# Patient Record
Sex: Female | Born: 1950 | Race: Black or African American | Hispanic: No | Smoking: Never smoker
Health system: Southern US, Community
[De-identification: ages and names within clinical notes are randomized; demographics above are authoritative.]

## PROBLEM LIST (undated history)

## (undated) DIAGNOSIS — N189 Chronic kidney disease, unspecified: Secondary | ICD-10-CM

## (undated) DIAGNOSIS — F32A Depression, unspecified: Secondary | ICD-10-CM

## (undated) DIAGNOSIS — M51369 Other intervertebral disc degeneration, lumbar region without mention of lumbar back pain or lower extremity pain: Secondary | ICD-10-CM

## (undated) DIAGNOSIS — M5136 Other intervertebral disc degeneration, lumbar region: Secondary | ICD-10-CM

## (undated) DIAGNOSIS — R7302 Impaired glucose tolerance (oral): Secondary | ICD-10-CM

## (undated) DIAGNOSIS — I10 Essential (primary) hypertension: Secondary | ICD-10-CM

## (undated) DIAGNOSIS — E785 Hyperlipidemia, unspecified: Secondary | ICD-10-CM

## (undated) DIAGNOSIS — K5790 Diverticulosis of intestine, part unspecified, without perforation or abscess without bleeding: Secondary | ICD-10-CM

## (undated) DIAGNOSIS — D126 Benign neoplasm of colon, unspecified: Secondary | ICD-10-CM

## (undated) DIAGNOSIS — F329 Major depressive disorder, single episode, unspecified: Secondary | ICD-10-CM

## (undated) HISTORY — DX: Chronic kidney disease, unspecified: N18.9

## (undated) HISTORY — DX: Impaired glucose tolerance (oral): R73.02

## (undated) HISTORY — DX: Benign neoplasm of colon, unspecified: D12.6

## (undated) HISTORY — DX: Diverticulosis of intestine, part unspecified, without perforation or abscess without bleeding: K57.90

## (undated) HISTORY — DX: Depression, unspecified: F32.A

## (undated) HISTORY — PX: APPENDECTOMY: SHX54

## (undated) HISTORY — DX: Major depressive disorder, single episode, unspecified: F32.9

## (undated) HISTORY — DX: Hyperlipidemia, unspecified: E78.5

## (undated) HISTORY — DX: Essential (primary) hypertension: I10

---

## 2000-07-08 HISTORY — PX: REPLACEMENT TOTAL KNEE: SUR1224

## 2001-09-22 ENCOUNTER — Other Ambulatory Visit: Admission: RE | Admit: 2001-09-22 | Discharge: 2001-09-22 | Payer: Self-pay | Admitting: Obstetrics and Gynecology

## 2003-03-14 ENCOUNTER — Emergency Department (HOSPITAL_COMMUNITY): Admission: EM | Admit: 2003-03-14 | Discharge: 2003-03-14 | Payer: Self-pay | Admitting: Emergency Medicine

## 2004-04-07 ENCOUNTER — Encounter: Payer: Self-pay | Admitting: Orthopedic Surgery

## 2004-05-08 ENCOUNTER — Encounter: Payer: Self-pay | Admitting: Orthopedic Surgery

## 2004-06-07 ENCOUNTER — Encounter: Payer: Self-pay | Admitting: Orthopedic Surgery

## 2005-03-03 ENCOUNTER — Emergency Department (HOSPITAL_COMMUNITY): Admission: EM | Admit: 2005-03-03 | Discharge: 2005-03-03 | Payer: Self-pay | Admitting: Emergency Medicine

## 2005-06-24 ENCOUNTER — Emergency Department (HOSPITAL_COMMUNITY): Admission: EM | Admit: 2005-06-24 | Discharge: 2005-06-25 | Payer: Self-pay | Admitting: Emergency Medicine

## 2005-07-08 DIAGNOSIS — D126 Benign neoplasm of colon, unspecified: Secondary | ICD-10-CM

## 2005-07-08 HISTORY — DX: Benign neoplasm of colon, unspecified: D12.6

## 2005-07-30 ENCOUNTER — Ambulatory Visit: Payer: Self-pay | Admitting: Internal Medicine

## 2005-08-02 ENCOUNTER — Ambulatory Visit (HOSPITAL_COMMUNITY): Admission: RE | Admit: 2005-08-02 | Discharge: 2005-08-02 | Payer: Self-pay | Admitting: Internal Medicine

## 2005-08-02 ENCOUNTER — Ambulatory Visit: Payer: Self-pay | Admitting: Internal Medicine

## 2005-08-02 ENCOUNTER — Encounter: Payer: Self-pay | Admitting: Internal Medicine

## 2005-08-02 HISTORY — PX: COLONOSCOPY: SHX174

## 2005-09-25 ENCOUNTER — Emergency Department (HOSPITAL_COMMUNITY): Admission: EM | Admit: 2005-09-25 | Discharge: 2005-09-25 | Payer: Self-pay | Admitting: Emergency Medicine

## 2005-11-16 ENCOUNTER — Emergency Department (HOSPITAL_COMMUNITY): Admission: EM | Admit: 2005-11-16 | Discharge: 2005-11-16 | Payer: Self-pay | Admitting: Emergency Medicine

## 2006-06-23 ENCOUNTER — Ambulatory Visit: Payer: Self-pay | Admitting: Internal Medicine

## 2006-07-14 ENCOUNTER — Ambulatory Visit: Payer: Self-pay | Admitting: Internal Medicine

## 2007-07-10 ENCOUNTER — Ambulatory Visit: Payer: Self-pay | Admitting: Internal Medicine

## 2007-11-13 ENCOUNTER — Emergency Department (HOSPITAL_COMMUNITY): Admission: EM | Admit: 2007-11-13 | Discharge: 2007-11-13 | Payer: Self-pay | Admitting: Emergency Medicine

## 2007-11-16 ENCOUNTER — Emergency Department (HOSPITAL_COMMUNITY): Admission: EM | Admit: 2007-11-16 | Discharge: 2007-11-16 | Payer: Self-pay | Admitting: Emergency Medicine

## 2008-02-13 ENCOUNTER — Emergency Department (HOSPITAL_COMMUNITY): Admission: EM | Admit: 2008-02-13 | Discharge: 2008-02-13 | Payer: Self-pay | Admitting: Emergency Medicine

## 2008-06-03 ENCOUNTER — Observation Stay (HOSPITAL_COMMUNITY): Admission: EM | Admit: 2008-06-03 | Discharge: 2008-06-05 | Payer: Self-pay | Admitting: Emergency Medicine

## 2008-06-04 ENCOUNTER — Encounter (INDEPENDENT_AMBULATORY_CARE_PROVIDER_SITE_OTHER): Payer: Self-pay | Admitting: General Surgery

## 2008-07-08 DIAGNOSIS — K5790 Diverticulosis of intestine, part unspecified, without perforation or abscess without bleeding: Secondary | ICD-10-CM

## 2008-07-08 HISTORY — DX: Diverticulosis of intestine, part unspecified, without perforation or abscess without bleeding: K57.90

## 2008-07-26 ENCOUNTER — Ambulatory Visit: Payer: Self-pay | Admitting: Internal Medicine

## 2008-08-22 ENCOUNTER — Ambulatory Visit: Payer: Self-pay | Admitting: Internal Medicine

## 2008-08-22 ENCOUNTER — Ambulatory Visit (HOSPITAL_COMMUNITY): Admission: RE | Admit: 2008-08-22 | Discharge: 2008-08-22 | Payer: Self-pay | Admitting: Internal Medicine

## 2008-08-22 HISTORY — PX: OTHER SURGICAL HISTORY: SHX169

## 2009-04-08 ENCOUNTER — Emergency Department (HOSPITAL_COMMUNITY): Admission: EM | Admit: 2009-04-08 | Discharge: 2009-04-08 | Payer: Self-pay | Admitting: Emergency Medicine

## 2009-10-30 ENCOUNTER — Emergency Department (HOSPITAL_COMMUNITY): Admission: EM | Admit: 2009-10-30 | Discharge: 2009-10-30 | Payer: Self-pay | Admitting: Emergency Medicine

## 2009-11-07 ENCOUNTER — Ambulatory Visit: Payer: Self-pay | Admitting: Internal Medicine

## 2009-11-07 DIAGNOSIS — K649 Unspecified hemorrhoids: Secondary | ICD-10-CM | POA: Insufficient documentation

## 2009-11-07 DIAGNOSIS — K5909 Other constipation: Secondary | ICD-10-CM

## 2009-11-07 DIAGNOSIS — Z860100 Personal history of colon polyps, unspecified: Secondary | ICD-10-CM | POA: Insufficient documentation

## 2009-11-07 DIAGNOSIS — K921 Melena: Secondary | ICD-10-CM

## 2010-04-03 ENCOUNTER — Ambulatory Visit: Payer: Self-pay | Admitting: Cardiology

## 2010-04-03 DIAGNOSIS — R079 Chest pain, unspecified: Secondary | ICD-10-CM | POA: Insufficient documentation

## 2010-04-03 DIAGNOSIS — E782 Mixed hyperlipidemia: Secondary | ICD-10-CM | POA: Insufficient documentation

## 2010-04-03 DIAGNOSIS — I10 Essential (primary) hypertension: Secondary | ICD-10-CM | POA: Insufficient documentation

## 2010-04-12 ENCOUNTER — Ambulatory Visit: Payer: Self-pay | Admitting: Cardiology

## 2010-04-12 ENCOUNTER — Encounter (HOSPITAL_COMMUNITY): Admission: RE | Admit: 2010-04-12 | Discharge: 2010-04-12 | Payer: Self-pay | Admitting: Cardiology

## 2010-04-12 ENCOUNTER — Encounter: Payer: Self-pay | Admitting: Cardiology

## 2010-04-13 ENCOUNTER — Ambulatory Visit: Payer: Self-pay | Admitting: Internal Medicine

## 2010-04-17 ENCOUNTER — Encounter: Payer: Self-pay | Admitting: Cardiology

## 2010-04-19 DIAGNOSIS — Z8719 Personal history of other diseases of the digestive system: Secondary | ICD-10-CM

## 2010-07-24 ENCOUNTER — Encounter (INDEPENDENT_AMBULATORY_CARE_PROVIDER_SITE_OTHER): Payer: Self-pay | Admitting: *Deleted

## 2010-07-27 ENCOUNTER — Ambulatory Visit
Admission: RE | Admit: 2010-07-27 | Discharge: 2010-07-27 | Payer: Self-pay | Source: Home / Self Care | Attending: Internal Medicine | Admitting: Internal Medicine

## 2010-08-07 NOTE — Letter (Signed)
Summary: [facility]  [facility]   Imported By: Raechel Ache [facility] 04/03/2010 16:49:44  _____________________________________________________________________  External Attachment:    Type:   Image     Comment:   External Document

## 2010-08-07 NOTE — Assessment & Plan Note (Signed)
Summary: RECTAL BLEEDING/SS   Visit Type:  consult Referring Provider:  Valeria Batman, [facility] ED Primary Care Provider:  [facility]  Chief Complaint:  rectal bleeding.  History of Present Illness: Ms. [name] [name] is a pleasant 60 y/o AA female, who presents today at request of Dr. Estell Harpin at [facility] ED, for further evaluation of brbpr. She was recently seen in ED with four day h/o small volume brbpr. She noted blood on tissue and in water. Associated with constipation. She came off her fiber pills which had been controlling her constipation. She past several hard stools around time of bleeding. She felt something "pop out" and then started noticing bleeding every time she wiped. Denies abd pain, frequent heartburn, weight loss, dysphagia, n/v.  She took course of hydrocortisone suppositories and rectal bleeding has resolved.   Her brother died due to metastatic colon cancer (dx in late 47s), funeral tomorrow. She has had two brothers died with colon cancer (dx in 79s) and her father was treated successfully for colon cancer in his 18s.    Current Medications (verified): 1)  Lisinopril 5 Mg Tabs (Lisinopril) .... Once Daily  Allergies (verified): 1)  ! Toradol 2)  ! * Latex  Past History:  Past Medical History: Depression Glucose intolerance Hyperlipidemia Hypertension TCS, 1/07 --> internal hemorrhoids, couple adenomatous polyps TCS/TI, 2/10 --> friable anal canal, early shallow left-sided diverticula  Past Surgical History: Appendectomy Hysterectomy Knee Replacement, 2002  Family History: Father, age 72, treated for colon cancer after age 79 Brother, 48, deceased due to metastatic colon cancer, diagnosed around mid 77s. Brother, deceased colon cancer, mid-50s Brother, deceased due to alcohol cirrhosis Mother, deceased, cancer-unknown source  Social History: Never smoked. Two children. Laid off, [employer] in [place] No alcohol, heavy in past but stopped  1994.  Review of Systems General:  Denies fever, chills, sweats, anorexia, weakness, and weight loss. Eyes:  Denies vision loss. ENT:  Denies nasal congestion, sore throat, hoarseness, and difficulty swallowing. CV:  Denies chest pains, angina, dyspnea on exertion, and peripheral edema. Resp:  Denies dyspnea at rest, dyspnea with exercise, and cough. GI:  See HPI. GU:  Denies urinary burning and blood in urine. MS:  Complains of joint pain / LOM. Derm:  Denies rash and itching. Neuro:  Denies weakness, frequent headaches, memory loss, and confusion. Psych:  Denies depression and anxiety. Endo:  Denies unusual weight change. Heme:  Denies bruising and bleeding. Allergy:  Denies hives and rash.  Vital Signs:  Patient profile:   60 year old female Height:      67 inches Weight:      230 pounds BMI:     36.15 Temp:     98.0 degrees F oral Pulse rate:   60 / minute BP sitting:   130 / 92  (left arm) Cuff size:   regular  Vitals Entered By: Hendricks Limes LPN (Nov 08, 2438 9:10 AM)  Physical Exam  General:  Well developed, well nourished, no acute distress. Head:  Normocephalic and atraumatic. Eyes:  Conjunctivae pink, no scleral icterus.  Mouth:  Oropharyngeal mucosa moist, pink.  No lesions, erythema or exudate.    Neck:  Supple; no masses or thyromegaly. Lungs:  Clear throughout to auscultation. Heart:  Regular rate and rhythm; no murmurs, rubs,  or bruits. Abdomen:  Bowel sounds normal.  Abdomen is soft, nontender, nondistended.  No rebound or guarding.  No hepatosplenomegaly, masses or hernias.  No abdominal bruits.  Rectal:  hemocult negative.  Palpable anal canal hemorrhoid. No masses in rectum. No stool present, secretions tested. Extremities:  No clubbing, cyanosis, edema or deformities noted. Neurologic:  Alert and  oriented x4;  grossly normal neurologically. Skin:  Intact without significant lesions or rashes. Cervical Nodes:  No significant cervical  adenopathy. Psych:  Alert and cooperative. Normal mood and affect.  Impression & Recommendations:  Problem # 1:  HEMATOCHEZIA (ICD-578.1)  Recent hematochezia likely due to benign anorectal disease. Has stopped with course of hydrocortisone suppositories. She has h/o internal hemorrhoids. Recent TCS 2/10 is reassuring. Advised treatment of constipation, initially can resume fiber supplements, but if needed can also use miralax 17 grams daily as needed. If she has recurrent or persistent bleeding she will let me know, otherwise OV with Dr. Jena Gauss in 3 months. Patient in agreement with plan.  Orders: Consultation Level III (11914) Hemoccult Guaiac-1 spec.(in office) (82270)

## 2010-08-07 NOTE — Assessment & Plan Note (Signed)
Summary: **NP6 CHEST PAIN AND ARM DISCOMFORT   Visit Type:  Initial Consult Primary Alek Poncedeleon:  Dr. Reynolds Bowl   History of Present Illness: 60 year old woman referred for cardiology consultation. She presents with a history of atypical chest pain as well as occasional feeling of shortness of breath and cough. She reports no clearly exertional symptoms, in fact reporting feeling well when she was exercising on a daily basis, walking up to 3 miles at a time at a local gym. She has not been exercising over the last few months related to an argument with her sister. She describes "sharp," brief, sporadic episodes of chest pain, usually associated with emotional upset. Also feeling of shortness of breath and cough that last for 5 minutes, fairly sporadic. She states she was treated with a course of antibiotics and steroids for possible strep throat a few weeks ago. Otherwise no other major health changes.  EKG, faxed copy, from 15 September showed sinus bradycardia at 55 beats per minute. She has not undergone any prior ischemic evaluation. Does report the death of a brother recently in his early 36s due to early onset cardiovascular disease. She has not been on regular therapy for hyperlipidemia and has a reported history of glucose intolerance, presently on no medications.  Current Medications (verified): 1)  Lisinopril 5 Mg Tabs (Lisinopril) .... Once Daily  Allergies (verified): 1)  ! Toradol 2)  ! * Latex  Past History:  Past Surgical History: Last updated: 11/07/2009 Appendectomy Hysterectomy Knee Replacement, 2002  Family History: Last updated: 04/03/2010 Father, age 58, treated for colon cancer after age 69 Brother, 57, deceased due to metastatic colon cancer, diagnosed around mid 60s. Brother, deceased colon cancer, mid-50s Brother, deceased due to alcohol cirrhosis Mother, deceased, cancer-unknown source Family History of Coronary Artery Disease: brother died in his 19s  after developing early onset cardiovascular disease  Social History: Last updated: 04/03/2010 Never smoked. Two children Laid off, [employer] in [place] No alcohol, heavy in past but stopped 1994  Past Medical History: Depression Glucose intolerance Hyperlipidemia Hypertension Chronic renal insufficiency TCS, 1/07 --> internal hemorrhoids, couple adenomatous polyps TCS/TI, 2/10 --> friable anal canal, early shallow left-sided diverticula  Family History: Father, age 30, treated for colon cancer after age 43 Brother, 47, deceased due to metastatic colon cancer, diagnosed around mid 40s. Brother, deceased colon cancer, mid-50s Brother, deceased due to alcohol cirrhosis Mother, deceased, cancer-unknown source Family History of Coronary Artery Disease: brother died in his 10s after developing early onset cardiovascular disease  Social History: Never smoked. Two children Laid off, [employer] in [place] No alcohol, heavy in past but stopped 1994  Review of Systems       The patient complains of chest pain.  The patient denies anorexia, fever, weight loss, syncope, dyspnea on exertion, peripheral edema, prolonged cough, hemoptysis, melena, and hematochezia.         No major sense of palpitations or dizziness. No dysphagia. No orthopnea or PND. No wheezing. Otherwise reviewed and negative.  Vital Signs:  Patient profile:   60 year old female Weight:      220 pounds BMI:     34.58 Pulse rate:   80 / minute BP sitting:   115 / 78  (right arm)  Vitals Entered By: Dreama Saa, CNA (April 03, 2010 1:06 PM)  Physical Exam  Additional Exam:  Overweight woman in no acute distress. HEENT: Conjunctiva and lids normal, oropharynx with moist mucosa. Neck: Supple, no elevated JVP or carotid bruits, no  thyromegaly. Lungs: Clear to auscultation, nonlabored. Cardiac: Regular rate and rhythm, no S3 or loud systolic murmur, no pericardial rub. Abdomen: Obese, nontender, bowel  sounds present. Skin: Warm and dry. Extremities: No pitting edema, distal pulses 2+. Musculoskeletal: No kyphosis. Neuropsychiatric: Alert and oriented x3, affect appropriate.   EKG  Procedure date:  04/03/2010  Findings:      Normal sinus rhythm at 73 beats per minute.  Impression & Recommendations:  Problem # 1:  CHEST PAIN (ICD-786.50)  Chest pain, fairly atypical in description, as well as occasional shortness of breath and cough. Not clearly exertional. Baseline electrocardiogram is normal. Cardiac risk factors include Brawn-standing hypertension, hyperlipidemia, glucose intolerance, and some family history of premature cardiovascular disease. She has not undergone any prior ischemic evaluation. Plan is to proceed with a 2-D echocardiogram as well as exercise Myoview. If low risk, would anticipate efforts at risk factor modification, specifically aggressive lipid and glucose control, and to return to regular exercise regimen. It may be that stress is contributing to her symptoms, possibly even reflux disease. If testing is significant abnormal, we can have her return to discuss the matter further.  Her updated medication list for this problem includes:    Lisinopril 5 Mg Tabs (Lisinopril) ..... Once daily  Orders: 2-D Echocardiogram (2D Echo) Nuclear Stress Test (Nuc Stress Test)  Problem # 2:  MIXED HYPERLIPIDEMIA (ICD-272.2)  Based on history. Patient reports that she was on a lipid-lowering medicine a few months ago. Details are not clear. Would suggest fairly aggressive lipid control, ideally with an LDL below 100.  Problem # 3:  ESSENTIAL HYPERTENSION, BENIGN (ICD-401.1)  Blood pressure well controlled today.  Her updated medication list for this problem includes:    Lisinopril 5 Mg Tabs (Lisinopril) ..... Once daily  Patient Instructions: 1)  Your physician recommends that you schedule a follow-up appointment in: post test 2)  Your physician recommends that you  continue on your current medications as directed. Please refer to the Current Medication list given to you today. 3)  Your physician has requested that you have an echocardiogram.  Echocardiography is a painless test that uses sound waves to create images of your heart. It provides your doctor with information about the size and shape of your heart and how well your heart's chambers and valves are working.  This procedure takes approximately one hour. There are no restrictions for this procedure. 4)  Your physician has requested that you have an exercise stress myoview.  For further information please visit [email or web address]  Please follow instruction sheet, as given.

## 2010-08-07 NOTE — Assessment & Plan Note (Signed)
Summary: FU IN 3 MONTHS WITH RMR PER LSL/SS   Visit Type:  Follow-up Visit Primary Care [name] [name]:  [name] [name]  Chief Complaint:  F/U rectal bleeding.  History of Present Illness: 60 year old lady with a history of intermittent painless hematochezia felt to be anorectal in origin. Colonoscopy 2010 demonstrated early left-sided diverticula and a friable anal canal. She has a history of hemorrhoids. She also has a very positive family history of colon cancer 3 for first degree relatives. She was here 3 mos ago; she's had 3 episodes of a small amount of rectal bleeding -  2 episode with straining one episode last week spontaneously. She is chronically constipated; does not take MiraLax regularly.  She does not take a fiber supplement. Hasn't had any associated abdominal pain.   Current Medications (verified): 1)  Lisinopril 5 Mg Tabs (Lisinopril) .... Once Daily 2)  Otc Fiberpill .... Three Daily  Allergies (verified): 1)  ! Toradol 2)  ! * Latex  Vital Signs:  Patient profile:   60 year old female Height:      67 inches Weight:      225 pounds BMI:     35.37 Temp:     97.8 degrees F oral Pulse rate:   68 / minute BP sitting:   122 / 82  (left arm) Cuff size:   large  Vitals Entered By: Cloria Spring LPN (April 13, 2010 2:05 PM)  Physical Exam  General:  pleasant 60 year old lady resting comfortably Abdomen:  nondistended positive bowel sounds soft nontender without mass or organomegaly  Impression & Recommendations: Impression: 60 year old lady with intermittent low-volume hematochezia in a setting of constipation. Impressive family  history for colon cancer and a personal hx of colonic adenoma; she has a  known hemorrhoids andl and left-sided diverticulosis.  Recommendations: MiraLax 17 g orally nightly if no bowel movement of any given day  10 day course of Anusol suppositories one per rectum at bedtime  Office visit in 3 months to assess her progress. Would have a low  threshold for moving up the timing of her next colonoscopy depending on her clinical response.  I do believe we are dealing with benign anorectal bleeding, however, we need to be vigilant in this setting.  Avoid constipation. Begin Benefiber 1 tablespoon daily samples from the office provided.  Appended Document: Orders Update    Clinical Lists Changes  Problems: Added new problem of DIVERTICULITIS, HX OF (ICD-V12.79) Orders: Added new Service order of Est. Patient Level IV ([address]) - Signed

## 2010-08-07 NOTE — Letter (Signed)
Summary: [place] Treadmill (Nuc Med Stress)  [facility]  [address], [place] [address]   Phone: [phone]  Fax: [phone]    Nuclear Medicine 1-Day Stress Test Information Sheet  Re:     Dakiya Gernert   DOB:     12-04-1950 MRN:     [id number] Weight:  Appointment Date: Register at: Appointment Time: Referring MD:  _X__Exercise Stress  __Adenosine   __Dobutamine  __Lexiscan  __Persantine   __Thallium  Urgency: ____1 (next day)   ____2 (one week)    ____3 (PRN)  Patient will receive Follow Up call with results: Patient needs follow-up appointment:  Instructions regarding medication:  How to prepare for your stress test: 1. DO NOT eat or drink after midnight the night prior to test. This includes no caffeine (coffee, tea, sodas, chocolate) if you were instructed to take your medications, drink water with it. 2. DO NOT use any tobacco products for at leaset 8 hours prior to arrival. 3. DO NOT wear dresses or any clothing that may have metal clasps or buttons. 4. Wear short sleeve shirts, loose clothing, and comfortalbe walking shoes. 5. DO NOT use lotions, oils or powder on your chest before the test. 6. The test will take approximately 3-4 hours from the time you arrive until completion. 7. To register the day of the test, go to the Short Stay entrance at [facility]. 8. If you must cancel your test, call [phone] as soon as you are aware.  After you arrive for test:   When you arrive at [facility], you will go to Short Stay to be registered. They will then send you to Radiology to check in. The Nuclear Medicine Tech will get you and start an IV in your arm or hand. A small amount of a radioactive tracer will then be injected into your IV. This tracer will then have to circulate for 30-45 minutes. During this time you will wait in the waiting room and you will be able to drink something without caffeine. A series of pictures will be taken  of your heart follwoing this waiting period. After the 1st set of pictures you will go to the stress lab to get ready for your stress test. During the stress test, another small amount of a radioactive tracer will be injected through your IV. When the stress test is complete, there is a short rest period while your heart rate and blood pressure will be monitored. When this monitoring period is complete you will have another set of pictrues taken. (The same as the 1st set of pictures). These pictures are taken between 15 minutes and 1 hour after the stress test. The time depends on the type of stress test you had. Your doctor will inform you of your test results within 7 days after test.    The possibilities of certain changes are possible during the test. They include abnormal blood pressure and disorders of the heart. Side effects of persantine or adenosine can include flushing, chest pain, shortness of breath, stomach tightness, headache and light-headedness. These side effects usually do not last Corl and are self-resolving. Every effort will be made to keep you comfortable and to minimize complications by obtaining a medical history and by close observation during the test. Emergency equipment, medications, and trained personnel are available to deal with any unusual situation which may arise.  Please notify office at least 48 hours in advance if you are unable to keep  this appt.

## 2010-08-07 NOTE — Letter (Signed)
Summary: [place] [facility], [facility] at [facility]  [address] [place] [address]   Phone: [phone]  Fax: [phone]      April 17, 2010 MRN: [id number]   [facility] [place] [address] [place], [place]  [address]   Dear Ms. Mccleary,  Your test ordered by Selena Batten has been reviewed by your physician (or physician assistant) and was found to be normal or stable. Your physician (or physician assistant) felt no changes were needed at this time.  ____ Echocardiogram  __X__ Cardiac Stress Test  ____ Lab Work  ____ Peripheral vascular study of arms, legs or neck  ____ CT scan or X-ray  ____ Lung or Breathing test  ____ Other:  Please continue on current medical treatment.  Thank you.   Nona Dell, MD, F.A.C.C

## 2010-08-09 NOTE — Letter (Signed)
Summary: Recall Office Visit  [facility]  [address]   [place], [place] [address]   Phone: [phone]  Fax: [phone]      July 24, 2010   [facility] [place] [address] [place], [place]  [address] 04-28-1951   Dear Ms. Haughton,   According to our records, it is time for you to schedule a follow-up office visit with [place].   At your convenience, please call [phone] to schedule an office visit. If you have any questions, concerns, or feel that this letter is in error, we would appreciate your call.   Sincerely,    Diana Eves  [facility] Ph: [phone]   Fax: [phone]

## 2010-08-09 NOTE — Assessment & Plan Note (Addendum)
Summary: dropped off stool/ss  pt returned ifobt and it was negative  Allergies: 1)  ! Toradol 2)  ! * Latex  Other Orders: Immuno-chemical Fecal Occult ([address])

## 2010-08-13 ENCOUNTER — Ambulatory Visit (INDEPENDENT_AMBULATORY_CARE_PROVIDER_SITE_OTHER): Payer: Medicaid Other | Admitting: Gastroenterology

## 2010-08-13 ENCOUNTER — Encounter: Payer: Self-pay | Admitting: Gastroenterology

## 2010-08-13 DIAGNOSIS — K5909 Other constipation: Secondary | ICD-10-CM

## 2010-08-23 NOTE — Assessment & Plan Note (Addendum)
Summary: FU OV IN 3 MONTHS,DIVERTICULITIS,CONSTIPATION/LAW   Vital Signs:  Patient profile:   60 year old female Height:      67 inches Weight:      228 pounds BMI:     35.84 Temp:     98.8 degrees F oral Pulse rate:   60 / minute BP sitting:   122 / 78  (left arm) Cuff size:   large  Vitals Entered By: Hendricks Limes LPN (August 13, 2010 10:55 AM)  Visit Type:  Follow-up Visit Primary Care Provider:  Casilda Carls   History of Present Illness: Pt presents today in f/u for hx of painless hematochezia and constipation. No reports of brbpr since last visit. Was treated with anusol suppositories and has done well. Reports BM 2X/week. Sometimes hard. Taking generic brand of fiber from [employer]; however, when was taking benefiber went once/day. Occasional bloating. Not taking miralax. Not on any stool softeners. Denies abdominal pain. ifobt Jan 2012 negative. Last TCS 2010 with left-sided diverticula and friable anal canal. Concerned because has several first-degree relatives with hx of colon ca. Brother just passed away a few months ago from this.   Current Medications (verified): 1)  Lisinopril 5 Mg Tabs (Lisinopril) .... Once Daily 2)  Otc Fiberpill .... Three Daily  Allergies (verified): 1)  ! Toradol 2)  ! * Latex  Past History:  Past Medical History: Reviewed history from 04/03/2010 and no changes required. Depression Glucose intolerance Hyperlipidemia Hypertension Chronic renal insufficiency TCS, 1/07 --> internal hemorrhoids, couple adenomatous polyps TCS/TI, 2/10 --> friable anal canal, early shallow left-sided diverticula  Review of Systems General:  Denies fever, chills, sweats, and anorexia. Eyes:  Denies blurring, irritation, and discharge. ENT:  Denies sore throat, hoarseness, and difficulty swallowing. CV:  Denies chest pains and syncope. Resp:  Denies dyspnea at rest and wheezing. GI:  Complains of constipation; denies difficulty swallowing, pain on  swallowing, nausea, indigestion/heartburn, abdominal pain, bloody BM's, and black BMs. GU:  Denies urinary burning and urinary frequency. MS:  Denies joint pain / LOM, joint swelling, and joint stiffness. Derm:  Denies rash, itching, and dry skin. Neuro:  Denies weakness and syncope. Psych:  Denies depression and anxiety. Endo:  Denies cold intolerance and heat intolerance.  Physical Exam  General:  Well developed, well nourished, no acute distress. Head:  Normocephalic and atraumatic. Lungs:  Clear throughout to auscultation. Heart:  Regular rate and rhythm; no murmurs, rubs,  or bruits. Abdomen:  +BS, soft, obese, non-tender, non-distended, no HSM, no rebound or guarding. no masses present Msk:  Symmetrical with no gross deformities. Normal posture. Neurologic:  Alert and  oriented x4;  grossly normal neurologically.   Impression & Recommendations:  Problem # 1:  CONSTIPATION, CHRONIC (ICD-3.35)  60 year old [place] American woman with hx of painless hematochezia thought to be secondary to benign anorectal source as well as significant chronic constipaiton. No brbpr since last visit; low threshold for repeat colonoscopy secondary to +FH of colon ca in several first-degree relatives. Not indicated at the moment, last done Jan 2012. Has done well with daily brand name fiber (benefiber). Only 2x/week if generic brand.  rx sent to pharmacy for benefiber daily Miralax each evening as needed for constipation Probiotic daily (samples given) F/U in 3 mos  Orders: Est. Patient Level II ([address])  Patient Instructions: 1)  Take supplemental fiber daily (prescription sent) 2)  Take Miralax each evening as needed for constipation 3)  Add a probiotic daily (samples given). This can be  purchased Advice worker.  4)  F/U in 3 mos.  5)  The medication list was reviewed and reconciled.  All changed / newly prescribed medications were explained.  A complete medication list was provided to the  patient / caregiver.  Prescriptions: BENEFIBER  POWD (WHEAT DEXTRIN) take 1 serving daily  #1 month x 3   Entered and Authorized by:   Gerrit Halls NP   Signed by:   Gerrit Halls NP on 08/13/2010   Method used:   Faxed to ...       [facility] (retail)       [address]       [place], [place]  [address]       Ph: [phone]       Fax: [phone]   RxID:   [id number] MIRALAX  POWD (POLYETHYLENE GLYCOL 3350) take 1 capful nightly as needed for constipation  #1 month x 3   Entered and Authorized by:   Gerrit Halls NP   Signed by:   Gerrit Halls NP on 08/13/2010   Method used:   Faxed to ...       [facility] (retail)       [address]       [place], [place]  [address]       Ph: [phone]       Fax: [phone]   RxID:   [id number]    Orders Added: 1)  Est. Patient Level II [[address]]  Appended Document: FU OV IN 3 MONTHS,DIVERTICULITIS,CONSTIPATION/LAW addendum: last colonoscopy 2010 not 2012.   Appended Document: FU OV IN 3 MONTHS,DIVERTICULITIS,CONSTIPATION/LAW 3 MONTH F/U OPV IS IN THE COMPUTER  Appended Document: FU OV IN 3 MONTHS,DIVERTICULITIS,CONSTIPATION/LAW let's get PR on pt. If constipation well-managed, we need to obtain an ifobt. If +, need to proceed with colonoscopy.

## 2010-09-25 LAB — COMPREHENSIVE METABOLIC PANEL
AST: 21 U/L (ref 0–37)
Albumin: 4.1 g/dL (ref 3.5–5.2)
Alkaline Phosphatase: 115 U/L (ref 39–117)
BUN: 14 mg/dL (ref 6–23)
Creatinine, Ser: 1.11 mg/dL (ref 0.4–1.2)
GFR calc Af Amer: >60 mL/min (ref >60)
Potassium: 3.8 mEq/L (ref 3.5–5.1)
Total Protein: 7.4 g/dL (ref 6.0–8.3)

## 2010-09-25 LAB — DIFFERENTIAL
Eosinophils Relative: 3 % (ref 0–5)
Lymphocytes Relative: 28 % (ref 12–46)
Monocytes Absolute: 0.3 10*3/uL (ref 0.1–1.0)
Monocytes Relative: 5 % (ref 3–12)
Neutro Abs: 3.2 10*3/uL (ref 1.7–7.7)

## 2010-09-25 LAB — CBC
HCT: 38.6 % (ref 36.0–46.0)
Platelets: 172 10*3/uL (ref 150–400)
RDW: 13.6 % (ref 11.5–15.5)

## 2010-09-28 ENCOUNTER — Encounter: Payer: Self-pay | Admitting: Urgent Care

## 2010-10-15 ENCOUNTER — Encounter: Payer: Self-pay | Admitting: Urgent Care

## 2010-10-15 ENCOUNTER — Ambulatory Visit (INDEPENDENT_AMBULATORY_CARE_PROVIDER_SITE_OTHER): Payer: Medicaid Other | Admitting: Urgent Care

## 2010-10-15 DIAGNOSIS — Z860100 Personal history of colon polyps, unspecified: Secondary | ICD-10-CM

## 2010-10-15 DIAGNOSIS — Z800 Family history of malignant neoplasm of digestive organs: Secondary | ICD-10-CM

## 2010-10-15 DIAGNOSIS — K5909 Other constipation: Secondary | ICD-10-CM

## 2010-10-15 DIAGNOSIS — K921 Melena: Secondary | ICD-10-CM

## 2010-10-15 NOTE — Assessment & Plan Note (Signed)
Next colonoscopy February 2015

## 2010-10-15 NOTE — Assessment & Plan Note (Signed)
Well controlled at this time with twice a day fiber supplement and when necessary MiraLax.

## 2010-10-15 NOTE — Progress Notes (Signed)
Primary Care Physician:  Forest Gleason, MD, MD Primary Gastroenterologist: Dr. Jena Gauss  Chief Complaint  Patient presents with  . Follow-up    chronic constipation, rectal bleeding, diverticulosis    HPI:  [name] [name] is a 60 y.o. female here for follow up for followup hematochezia.  Rectal bleeding resolved.  Bm twice per wk.  Denies straining or abd pain.  Wt stable.  Appetite ok.  Benefiber BID & Miralax once per week. Satisfied w/ BMs.  Is having some low back pain, strained her back yesterday while lifting a tire doing yard work.   Current Outpatient Prescriptions  Medication Sig Dispense Refill  . lisinopril (PRINIVIL,ZESTRIL) 5 MG tablet Take 5 mg by mouth daily.        . polyethylene glycol powder (MIRALAX) powder Take 17 g by mouth daily. Take 1 capful nightly as needed for constipation       . Wheat Dextrin (BENEFIBER) POWD Take by mouth 1 dose over 46 hours.          Allergies as of 10/15/2010 - Review Complete 10/15/2010  Allergen Reaction Noted  . Ketorolac tromethamine    . Latex      Review of Systems: Gen: Denies any fever, chills, sweats, anorexia, fatigue, weakness, malaise, weight loss, and sleep disorder CV: Denies chest pain, angina, palpitations, syncope, orthopnea, PND, peripheral edema, and claudication. Resp: Denies dyspnea at rest, dyspnea with exercise, cough, sputum, wheezing, coughing up blood, and pleurisy. GI: Denies vomiting blood, jaundice, and fecal incontinence.   Denies dysphagia or odynophagia. Derm: Denies rash, itching, dry skin, hives, moles, warts, or unhealing ulcers.  Psych: Denies depression, anxiety, memory loss, suicidal ideation, hallucinations, paranoia, and confusion. Heme: Denies bruising, bleeding, and enlarged lymph nodes.  Physical Exam: BP 133/87  Pulse 63  Temp 97.8 F (36.6 C)  Ht 5' 7.25" (1.708 m)  Wt 223 lb (101.152 kg)  BMI 34.67 kg/m2  SpO2 99% General:   Alert,  Well-developed, well-nourished, pleasant and cooperative  in NAD Head:  Normocephalic and atraumatic. Eyes:  Sclera clear, no icterus.   Conjunctiva pink. Mouth:  No deformity or lesions, dentition normal. Neck:  Supple; no masses or thyromegaly. Heart:  Regular rate and rhythm; no murmurs, clicks, rubs,  or gallops. Abdomen:  Soft, nontender and nondistended. No masses, hepatosplenomegaly or hernias noted. Normal bowel sounds, without guarding, and without rebound.   Msk:  Symmetrical without gross deformities. Normal posture. Pulses:  Normal pulses noted. Extremities:  Without clubbing or edema. Neurologic:  Alert and  oriented x4;  grossly normal neurologically. Skin:  Intact without significant lesions or rashes. Cervical Nodes:  No significant cervical adenopathy. Psych:  Alert and cooperative. Normal mood and affect.

## 2010-10-15 NOTE — Progress Notes (Signed)
RMR PT 

## 2010-10-15 NOTE — Assessment & Plan Note (Signed)
Next colonoscopy February 2015 

## 2010-10-15 NOTE — Patient Instructions (Addendum)
High fiber diet Continue Benefiber MiraLax 17 g daily as needed for constipation Colonoscopy 2015 Call if any further rectal bleeding  High-Fiber Diet A high-fiber diet changes your normal diet to include more whole grains, legumes, fruits, and vegetables. Changes in the diet involve replacing refined carbohydrates with unrefined foods. The calorie level of the diet is essentially unchanged. The Dietary Reference Intake (recommended amount) for adult males is 38 grams per day. For adult females, it is 25 grams per day. Pregnant and lactating women should consume 28 grams of fiber per day. Fiber is the intact part of a plant that is not broken down during digestion. Functional fiber is fiber that has been isolated from the plant to provide a beneficial effect in the body. PURPOSE  Increase stool bulk.   Ease and regulate bowel movements.   Lower cholesterol.  INDICATIONS THAT YOU NEED MORE FIBER  Constipation and hemorrhoids.   Uncomplicated diverticulosis (intestine condition) and irritable bowel syndrome.   Weight management.   As a protective measure against hardening of the arteries (atherosclerosis), diabetes, and cancer.  NOTE OF CAUTION If you have a digestive or bowel problem, ask your caregiver for advice before adding high-fiber foods to your diet. Some of the following medical problems are such that a high-fiber diet should not be used without consulting your caregiver. DO NOT USE WITH:  Acute diverticulitis (intestine infection).   Partial small bowel obstructions.   Complicated diverticular disease involving bleeding, rupture (perforation), or abscess (boil, furuncle).   Presence of autonomic neuropathy (nerve damage) or gastric paresis (stomach cannot empty itself).  GUIDELINES FOR INCREASING FIBER IN THE DIET  Start adding fiber to the diet slowly. A gradual increase of about 5 more grams (2 slices of whole-wheat bread, 2 servings of most fruits or vegetables, or 1  bowl of high-fiber cereal) per day is best. Too rapid an increase in fiber may result in constipation, flatulence, and bloating.   Drink enough water and fluids to keep your urine clear or pale yellow. Water, juice, or caffeine-free drinks are recommended. Not drinking enough fluid may cause constipation.   Eat a variety of high-fiber foods rather than one type of fiber.   Try to increase your intake of fiber through using high-fiber foods rather than fiber pills or supplements that contain small amounts of fiber.   The goal is to change the types of food eaten. Do not supplement your present diet with high-fiber foods, but replace foods in your present diet.  INCLUDE A VARIETY OF FIBER SOURCES  Replace refined and processed grains with whole grains, canned fruits with fresh fruits, and incorporate other fiber sources. White rice, white breads, and most bakery goods contain little or no fiber.   Brown whole-grain rice, buckwheat oats, and many fruits and vegetables are all good sources of fiber. These include: broccoli, Brussels sprouts, cabbage, cauliflower, beets, sweet potatoes, white potatoes (skin on), carrots, tomatoes, eggplant, squash, berries, fresh fruits, and dried fruits.   Cereals appear to be the richest source of fiber. Cereal fiber is found in whole grains and bran. Bran is the fiber-rich outer coat of cereal grain, which is largely removed in refining. In whole-grain cereals, the bran remains. In breakfast cereals, the largest amount of fiber is found in those with "bran" in their names. The fiber content is sometimes indicated on the label.   You may need to include additional fruits and vegetables each day.   In baking, for 1 cup white flour, you may  use the following substitutions:   1 cup whole-wheat flour minus 2 tablespoons.   1/2 cup white flour plus 1/2 cup whole-wheat flour.  References: Dietary Reference Intakes: Recommended Intakes for Individuals. [employer]. Institute of Medicine. Food and Nutrition Board. Document Released: 06/24/2005 Document Re-Released: 09/18/2009 [facility] Patient Information 2011 [place], [place].

## 2010-10-15 NOTE — Assessment & Plan Note (Addendum)
Resolved, suspect secondary to benign anorectal source. Patient is to call if hematochezia recurs.

## 2010-11-20 NOTE — Op Note (Signed)
NAME:  [name] [name], Mette                  ACCOUNT NO.:  [id number]   MEDICAL RECORD NO.:  [id number]          PATIENT TYPE:  AMB   LOCATION:  DAY                           FACILITY:  [facility]   PHYSICIAN:  R. Roetta Sessions, M.D. DATE OF BIRTH:  05/19/1951   DATE OF PROCEDURE:  08/22/2008  DATE OF DISCHARGE:                               OPERATIVE REPORT   PROCEDURE:  Ileocolonoscopy, diagnostic.   INDICATIONS FOR PROCEDURE:  A 60 year old lady with a personal history  of colonic adenomas, positive family history of colon cancer with 3  first-degree relatives, 2 with relatively young age.  She has had  intermittent low-volume hematochezia in the setting of known  hemorrhoids.  She is here for a surveillance/diagnostic purposes.  Risks, benefits, and limitations of the approach have been reviewed,  questions answered.  Please see the documentation in the medical record.   PROCEDURE NOTE:  O2 saturation, blood pressure, pulse, and respirations  were monitored throughout the entire procedure.   CONSCIOUS SEDATION:  Versed 5 mg IV and Demerol 100 mg IV in divided  doses.   INSTRUMENT:  Pentax video chip system.   FINDINGS:  Digital rectal exam revealed no abnormalities.  Endoscopic  findings:  Prep was adequate.  Colon:  Colonic mucosa was surveyed from  the rectosigmoid junction through the left transverse, right colon  appendiceal orifice, ileocecal valve, and cecum.  These structures well  seen and photographed for the record.  Terminal ileum was elevated to 10  cm.  From this level, scope was slowly withdrawn.  All previously  mentioned mucosal surfaces were again seen.  The patient was noted to  have early shallow scattered left-sided diverticulum.  Colonic mucosa  and terminal ileal mucosa appeared entirely normal.  The scope was  pulled down the rectum.  Thorough examination of the rectal mucosa  including en face view of the anal canal demonstrated a friable anal  canal tissues.  There  was no discrete hemorrhoid tag seen today.  The  rectal mucosa itself appeared entirely normal.  The patient tolerated  the procedure well and was reactive in Endoscopy.   IMPRESSION:  Friable anal canal, otherwise normal-appearing rectal  mucosa, early shallow left-sided diverticula, colonic mucosa, and  terminal ileal mucosa appeared normal.  I suspect the patient's bleeding  intermittently from anorectum.   RECOMMENDATIONS:  1. Diverticulosis literature provided to Ms. Dowling, Benefiber 1      tablespoon daily.  2. Anusol-HC Suppositories one per rectum at bedtime x10 days.  3. She should return for repeat colonoscopy in 5 years and I have also      recommended she and her family should have medical genetic testing      in one of the tertiary referral centers as family members may have      increased risk.      Jonathon Bellows, M.D.  Electronically Signed     RMR/MEDQ  D:  08/22/2008  T:  08/22/2008  Job:  [id number]   cc:   [facility]  [place], [place]

## 2010-11-20 NOTE — Op Note (Signed)
NAME:  [name] [name], [name] [name]                  ACCOUNT NO.:  [id number]   MEDICAL RECORD NO.:  [id number]          PATIENT TYPE:  OBV   LOCATION:  A337                          FACILITY:  [facility]   PHYSICIAN:  Dalia Heading, M.D.  DATE OF BIRTH:  [birth year]   DATE OF PROCEDURE:  06/04/2008  DATE OF DISCHARGE:                               OPERATIVE REPORT   PREOPERATIVE DIAGNOSIS:  Acute appendicitis.   POSTOPERATIVE DIAGNOSIS:  Acute appendicitis.   PROCEDURE:  Laparoscopic appendectomy.   SURGEON:  Dalia Heading, MD   ANESTHESIA:  General endotracheal.   INDICATIONS:  The patient is a 60 year old white female who presents  with a 24-hour history of worsening right lower quadrant abdominal pain.  The risks and benefits of the procedure including bleeding, infection,  and the possibly of an open procedure were fully explained to the  patient, gave informed consent.   PROCEDURE NOTE:  The patient was placed in the supine position.  After  induction of general endotracheal anesthesia, the abdomen was prepped  and draped in the usual sterile technique with Betadine.  Surgical site  confirmation was performed.   A supraumbilical incision was made down to the fascia.  A Veress needle  was introduced into the abdominal cavity and confirmation of placement  was done using the saline drop test.  The abdomen was then insufflated  to 16 mmHg pressure.  An 11-mm trocar was introduced into the abdominal  cavity under direct visualization without difficulty.  The patient was  placed in deeper Trendelenburg position.  Additional 12-mm trocar was  placed in suprapubic region.  A 5-mm trocar was placed in the left lower  quadrant region.  The appendix was visualized and noted be acutely  inflamed with surrounding edema of the mesentery.  The appendix was  divided using the Harmonic scalpel.  The base of the appendix was found.  A standard Endo-GIA was placed across the base of the appendix and  fired.   The appendix was removed using an Endocatch bag.  The right  lower quadrant and pelvis were copiously irrigated with normal saline.  No purulent fluid was found.  All fluid and air were evacuate from the  abdominal cavity prior to removal of the trocars.   All wounds were irrigated with normal saline.  All wounds were checked  with 0.5 % Sensorcaine.  The supraumbilical fascia was reapproximated  using an 0 Vicryl interrupted suture.  All skin incisions were closed  using staples.  Betadine ointment and dry sterile dressings were  applied.   All tape and needle counts correct at the end of the procedure.  The  patient was extubated in the operating room and went back to recovery  room awake and stable condition.   COMPLICATIONS:  None.   SPECIMEN:  Appendix.   BLOOD LOSS:  Minimal.      Dalia Heading, M.D.  Electronically Signed     MAJ/MEDQ  D:  06/04/2008  T:  06/04/2008  Job:  [id number]   cc:   Bethena Midget, MD

## 2010-11-20 NOTE — Assessment & Plan Note (Signed)
[name] [name], [name] [name]                   CHART#:  [id number]   DATE:  07/10/2007                       DOB:  03/16/1951   CHIEF COMPLAINT:  Followup chronic constipation and abdominal bloating.   The patient is a 60 year old female.  She has a history of chronic  constipation.  She has a history of intermittent hematochezia.  She had  a colonoscopy in January 2007 which revealed internal hemorrhoids  otherwise normal exam.  She did have some polyps ablated from the left  colon to the ascending colon which were adenomatous.  She is due for a  repeat colonoscopy in 2010.  She has been taking MiraLax intermittently.  It does seem to help with her constipation, however, more recently she  has ran out.  She has been taking over-the-counter laxative.  She rarely  sees intermittent small-volume hematochezia felt to be due to her  hemorrhoids.  She generally has a bowel movement every day or every  other day.  She has noticed recently that she has had some intermittent  nausea without vomiting.  This has been going on for 2 days.  She also  complains of water brash and heartburn and indigestion.  She has not  tried anything for this yet.  She does drink a significant amount of  coffee.  She denies any history of overeating.  She is overweight but  has lost 5 pounds in the past year.   </CURRENT MEDICATIOS  See updated list.   ALLERGIES:  LATEX AND TORADOL.   Objective weight 228 pounds, height 58 inches, temp 97.9, blood pressure  130/82, and pulse 76.  GENERAL:  The patient is a well-developed, obese female who is alert,  oriented, pleasant, cooperative in no acute distress.  HEENT: Sclerae clear.  Non-icteric.  Oropharynx pink and moist without  any lesions.  HEART:  Regular rate and rhythm.  Normal S1, S2 without murmurs, rubs or  gallops.  ABDOMEN:  Positive bowel sounds x4.  No bruits auscultated.  Nontender,  non-distended.  No rebound tenderness or guarding.  EXTREMITIES:  Without  clubbing or edema bilaterally.  SKIN:  Warm and dry without any rashes or jaundice.   ASSESSMENT:  Chronic constipation with intermittent hematochezia  secondary to hemorrhoids (none recently), has ran out of MiraLax and has  been using over-the-counter laxatives.  I discouraged their use.  She  should resume MiraLax.   New-onset dyspepsia and water brash.   Family history of colon cancer/personal history of adenomatous polyps.   PLAN:  1. She is to begin MiraLax 17 grams daily for constipation.  2. Constipation and GERD literature given for review.  3. Trial of Zantac, over-the-counter, or Pepcid AC for her new-onset      of dyspepsia.  If things are not resolved, she understands she is      to call me back and we will proceed with a trial of PPI.  4. Colonoscopy in February of 2010 given the history of adenomatous      polyps/family history of colon cancer.       [name] [name], N.P.  Electronically Signed     R. Roetta Sessions, M.D.  Electronically Signed    KJ/MEDQ  D:  07/11/2007  T:  07/11/2007  Job:  [id number]

## 2010-11-20 NOTE — H&P (Signed)
NAME:  [name], [name]                  ACCOUNT NO.:  [id number]   MEDICAL RECORD NO.:  [id number]          PATIENT TYPE:  AMB   LOCATION:  DAY                           FACILITY:  [facility]   PHYSICIAN:  R. Roetta Sessions, M.D. DATE OF BIRTH:  12/13/1950   DATE OF ADMISSION:  DATE OF DISCHARGE:  LH                              HISTORY & PHYSICAL   CHIEF COMPLAINT:  History of colonic adenomas, positive family history  of colon cancer, hematochezia.   HISTORY OF PRESENT ILLNESS:  Ms. [name] Vantuyl. [name] is a very pleasant 59-  year-old [place] American lady from [place], who I saw back in  2007 for colonoscopy.  She had adenomas in her left and right colon  which were removed.  She has a more positive family history of colon  cancer at relatively young ages in 2 brothers and her father.  In fact,  her baby brother just died of metastatic colorectal cancer a couple of  months ago.  Ms. [name] [name] has known internal hemorrhoids and is chronically  constipated and does have continued intermittent low-volume  hematochezia.  She had been on Amitiza, but this produced nausea and she  stopped taking it.  She takes over-the-counter agents, but is no longer  taking GlycoLax/MiraLax, although this seemed to work for her very  nicely previously.  She may go up to 2 weeks without a bowel movement.  She does not take an over-the-counter agent.   Since I last saw this nice lady (July 11, 2007), she developed right  lower quadrant abdominal pain, nausea, and vomiting just before  Christmas 2009 and was ultimately diagnosed with acute appendicitis and  underwent a laparoscopic cholecystectomy by Dr. Lovell [name].  She is doing  well from that episode, not really having any abdominal pain now.  No  nausea, vomiting, odynophagia, dysphagia, early satiety, reflux  symptoms.   PAST MEDICAL HISTORY:  Glucose intolerance, hypertension,  hypercholesterolemia, depression, right knee replacement, hysterectomy.   PAST  SURGERIES:  Right knee replacement, hysterectomy, appendectomy.   CURRENT MEDICATIONS:  Currently not taking any medications, but ran out  of the blood pressure medication recently, previously on Atacand.   ALLERGIES:  TORADOL and LATEX (contact dermatitis).   FAMILY HISTORY:  As outlined above.   SOCIAL HISTORY:  The patient does not use tobacco or alcohol or illicit  drugs.   REVIEW OF SYSTEMS:  No chest pain.  No dyspnea.  No fever or chills.  Weight is down 8 pounds from which she weighed on June 23, 2006,  stable from July 10, 2007.   PHYSICAL EXAMINATION:  GENERAL:  A pleasant 60 year old lady resting  comfortably.  VITAL SIGNS:  Weight 228, height 5 feet 4 inches, temperature 98.8, BP  142/90, pulse 80.  SKIN:  Warm and dry.  HEENT:  No scleral icterus.  Conjunctivae are pink.  CHEST/LUNGS:  Clear to auscultation.  CARDIAC:  Regular rate and rhythm without murmur, gallop, or rub.  BREAST:  Deferred.  ABDOMEN:  Nondistended.  Laparoscopic port sites well healed.  Positive  bowel sounds.  Soft, entirely  nontender without appreciable mass or  organomegaly.  EXTREMITIES:  No edema.  RECTAL:  Deferred at the time of colonoscopy.   IMPRESSIONS:  Ms. [name] Everly. [name] is a very pleasant 60 year old lady with  a personal history colonic adenomas and a marked positive family history  of colon cancer in 3 first-degree relatives with 2 at relatively young  ages (one sibling younger than [name] [name]).  She has intermittent low-  volume hematochezia in the setting of known hemorrhoids.  She was slated  to have a 3-year followup colonoscopy at that time, has arrived.  I told  [name] [name] that we have to go ahead and do a surveillance/diagnostic  colonoscopy in the near future at [facility].  We would like to  wait a little longer since she just had an appendectomy last month.  We  will slate her for colonoscopy in middle of February.  Risks, benefits,  alternatives, limitations  have been reviewed, questions answered.  She  is agreeable.  We will make further recommendations at that time.      Jonathon Bellows, M.D.  Electronically Signed     RMR/MEDQ  D:  07/26/2008  T:  07/26/2008  Job:  [id number]   cc:   [facility]

## 2010-11-20 NOTE — Discharge Summary (Signed)
NAME:  [name] [name], [name] [name]                  ACCOUNT NO.:  [id number]   MEDICAL RECORD NO.:  [id number]          PATIENT TYPE:  OBV   LOCATION:  A337                          FACILITY:  [facility]   PHYSICIAN:  Dalia Heading, M.D.  DATE OF BIRTH:  1951/02/11   DATE OF ADMISSION:  06/03/2008  DATE OF DISCHARGE:  11/29/2009LH                               DISCHARGE SUMMARY   HOSPITAL COURSE SUMMARY:  The patient is a 60 year old black female who  presented to the emergency room with worsening upper abdominal pain.  She was admitted by the hospitalist for further evaluation and  treatment.  Her pain subsequently radiated to the right lower quadrant.  A CT scan of the abdomen and pelvis was performed, which revealed acute  appendicitis.  A surgery consultation was obtained and the patient was  taken to the operating room on June 04, 2008, and underwent  laparoscopic appendectomy.  She tolerated the procedure well.  Postoperative course has been unremarkable.  Her diet was advanced  without difficulty.   The patient is being discharged home on postoperative day #1 in good  improving condition.   DISCHARGE INSTRUCTIONS:  The patient is to followup Dr. Franky Macho on  June 14, 2008.   DISCHARGE MEDICATIONS:  1. Vicodin 1-2 tablets p.o. q.4 h. p.r.n. pain.  2. Lisinopril/hydrochlorothiazide 1 tablet p.o. daily.   PRINCIPAL DIAGNOSES:  1. Acute appendicitis.  2. Hypertension.   PRINCIPAL PROCEDURE:  Laparoscopic appendectomy on June 04, 2008.      Dalia Heading, M.D.  Electronically Signed     MAJ/MEDQ  D:  06/05/2008  T:  06/05/2008  Job:  [id number]   cc:   Dr. Lacie Scotts

## 2010-11-20 NOTE — H&P (Signed)
NAME:  [name] [name], [name] [name]                  ACCOUNT NO.:  [id number]   MEDICAL RECORD NO.:  [id number]          PATIENT TYPE:  OBV   LOCATION:  A337                          FACILITY:  [facility]   PHYSICIAN:  Margaretmary Dys, M.D.DATE OF BIRTH:  1951-06-11   DATE OF ADMISSION:  06/03/2008  DATE OF DISCHARGE:  LH                              HISTORY & PHYSICAL   ADMISSION DIAGNOSES:  1. Acute abdominal/epigastric pain.  2. Probable peptic ulcer disease.  3. Dehydration.  4. Nausea with vomiting, possibly acute gastritis.  5. Hypotension.  6. Possibly hypovolemia from dehydration.   CHIEF COMPLAINT:  Nausea, vomiting and abdominal pain of 1 day duration.   HISTORY OF PRESENT ILLNESS:  Ms. Hatfield is a 60 year old female who  presents to the emergency room with mostly epigastric pain.  The patient  said she developed acute onset of epigastric pain at about 6 a.m. this  morning.  She subsequently had some nausea and vomiting.  She vomited  mostly greenish material.  She saw no blood.  The patient said the  abdominal pain improved slightly, but came back again later.  It  appeared to be worsened after she ate some [place] sandwich, although she  did not throw up the [place] sandwich.  She rates her pain right now as  10/10.  It is nonradiating, not radiating to the back or to other parts  of her abdomen.  It has mostly remained in the epigastrium.  She denies  any fevers.  She has had some nausea, but no more vomiting episodes.  She denies any diarrhea.  She had a bowel movement early this morning.  Denies any alcohol ingestion.  She denies any concerns for food  poisoning.  She lives with her daughter and she does not have any of  those symptoms.  Pain is mostly colicky in nature, waxing and waning.  There is also no history of cramping occasionally.   REVIEW OF SYSTEMS:  A 10-point review of systems otherwise negative  except as mentioned in history of present illness.   PAST MEDICAL HISTORY:  1. History of GI bleed likely related to polyp.  2. History of hypertension.  3. Dyslipidemia.  4. Depression.  5. History of gastroesophageal reflux disease.  6. History of asthma.   MEDICATIONS:  1. Atacand 80 1 tablet daily.  2. Ventolin inhaler 2 puffs as needed.  3. Avelox 400 mg p.o. once a day.   ALLERGIES:  LATEX.   SOCIAL HISTORY:  The patient is married, lives with her daughter.  No  history of alcohol, drug or tobacco use.   FAMILY HISTORY:  Positive for hypertension, diabetes, colon cancer in a  brother and the father.  Lung cancer in another brother.   PAST SURGICAL HISTORY:  Status post right knee replacement about a year  ago.   PHYSICAL EXAMINATION:  GENERAL:  The patient was conscious, alert,  comfortable in mild pain distress.  VITAL SIGNS:  Blood pressure was 108/73.  The patient actually was  orthostatic in the emergency room when blood pressure dropped to 95/53.  Her pulse  was 81, respirations 20, temperature 98.4 degrees Fahrenheit,  oxygen saturation is 98% on room air.  HEENT:  Normocephalic, atraumatic.  Oral mucosa was dry.  No exudates  were noted.  NECK:  Supple.  No JVD, lymphadenopathy.  LUNGS:  Clinically good air entry bilaterally.  HEART:  S1-S2, regular.  No S3, S4, gallops or rubs.  ABDOMEN:  Soft.  The patient had moderate-severe epigastric tenderness  with some rebound and guarding.  Rest of abdomen was fairly benign and  soft.  There were good bowel sounds.  EXTREMITIES:  No pitting or pedal edema.  No calf induration or  tenderness was noted.  CENTRAL NERVOUS SYSTEM:  Grossly intact other  than being distressed from pain.   LABORATORY DATA:  White blood cell count 11.3, hemoglobin of 13.4,  hematocrit 39.5, platelet count was 176 with 84% neutrophils, sodium is  137, potassium is 3.3, chloride of 102, CO2 is 29, glucose 126, BUN of  18, creatinine is 1.19, AST 24, ALT of 29, lipase is 22.  Urinalysis was  negative.   DIAGNOSTICS:   Abdominal x-ray shows no definite acute findings.   ASSESSMENT:  A 60 year old female presenting with acute onset of nausea,  vomiting and acute epigastric pain.  The patient could have an acute  gastritis or possibly peptic ulcer disease.   PLAN:  1. The patient will be admitted to the medical floor.  2. We will give IV fluid hydration.  3. We will place the patient on the Protonix 40 mg IV q.12 h.  4. DVT prophylaxis will be Lovenox.  5. I will put the patient on Phenergan and Zofran p.r.n. for nausea      and vomiting.  6. We will hold the patient's antihypertensive medications at home.  7. The patient will be placed on a clear liquid diet for now.   I have explained the above plan to the patient.  She verbalized full  understanding.      Margaretmary Dys, M.D.  Electronically Signed    AM/MEDQ  D:  06/03/2008  T:  06/04/2008  Job:  [id number]

## 2010-11-23 NOTE — Consult Note (Signed)
NAME:  [name] [name], [name] [name]                  ACCOUNT NO.:  [id number]   MEDICAL RECORD NO.:  [id number]          PATIENT TYPE:  AMB   LOCATION:                                FACILITY:  [facility]   PHYSICIAN:  R. Roetta Sessions, M.D. DATE OF BIRTH:  [birth year]   DATE OF CONSULTATION:  07/30/2005  DATE OF DISCHARGE:                                   CONSULTATION   PHYSICIAN REQUESTING CONSULTATION:  Vania Rea, M.D. hospitalist to  [facility]   REASON FOR CONSULTATION:  Rectal bleeding.   HISTORY OF PRESENT ILLNESS:  [name] [name] is a 60 year old African American female  who presents today at the request of Dr. Orvan Falconer for further evaluation of  rectal bleeding. She went to the emergency department in December because of  complaints of rectal bleeding and rectal pain. She says since she had her  right knee replacement in May 2006 she has had constipation. She takes one  stool softener daily. She often has rectal pain with bowel movements. She  has passed bright red blood per rectum as well as clots. She takes Anusol  suppositories or equivalent intermittently with some relief of the bleeding.  She generally has a bowel movement once a week. Denies any melena. She has  abdominal discomfort related to constipation. No nausea or vomiting, heart  burn. She reports having a colonoscopy in the remote past. She does not  recall many of the details, but according to Dr. Blair Dolphin note she had  rectal polyps removed seven years ago by Dr. Harold Hedge in [place].   FAMILY HISTORY:  Colon cancer. One brother had colon cancer with surgery at  age 71. He has a colostomy. She believes her father also had colon cancer,  but she is not sure if it was prostate cancer instead.   While in the emergency department she had a normal hemoglobin 12.7,  hematocrit 36.2, MCV 92.1. A CT of the abdomen and pelvis with contrast  revealed minimal pericardial effusion otherwise unremarkable.   CURRENT MEDICATIONS:  1.  Atacand 10 mg daily.  2.  Stool softener one daily.   ALLERGIES:  1.  LATEX.  2.  TORADOL causes difficulty with breathing.   PAST MEDICAL HISTORY:  1.  Borderline diabetes mellitus.  2.  Depression.  3.  Hypercholesterolemia.  4.  Hypertension.   PAST SURGICAL HISTORY:  1.  Right knee replacement in May 2006.  2.  She initially had a partial hysterectomy followed by a bilateral      oophorectomy.  3.  Colonoscopy in the remote past. Details unavailable.   FAMILY HISTORY:  Mother died at age 41 of unknown cancer. Father is 18 years  old, was treated for colon cancer or prostate cancer in August in 2005.  Details unavailable. She has a brother who was treated for colon cancer in  his 8s. Another brother treated for lung cancer in his 23s.   SOCIAL HISTORY:  She is separated. She has a daughter and a son. She works  for [employer] but states she is out on medical leave.  She has never been a  smoker. She denies any alcohol use.   REVIEW OF SYSTEMS:  Gastrointestinal:  See HPI. Constitutional:  Denies any  weight loss. Cardiopulmonary: No chest pain, palpitations or shortness of  breath.   PHYSICAL EXAMINATION:  VITAL SIGNS:  Weight 228-1/2, height 5 feet 6 inches,  temperature 98, blood pressure 142/82, pulse 82.  GENERAL:  A pleasant, well-nourished, well-developed black female in no  acute distress.  SKIN:  Warm and dry, no jaundice.  HEENT:  Conjunctivae are pink. Sclerae nonicteric. Oropharyngeal mucosa  moist and pink. No lesions, erythema or exudate. No lymphadenopathy,  thyromegaly.  CHEST:  Lungs are clear to auscultation.  CARDIAC:  Regular rate and rhythm. Normal S1, S2. No murmurs, rubs or  gallops.  ABDOMEN:  Positive bowel sounds, soft, nontender, nondistended, no  organomegaly or masses. No rebound, tenderness or guarding. No abdominal  bruits or hernias.  EXTREMITIES:  No edema.  RECTAL:  Deferred at time of colonoscopy.   IMPRESSION:  [name] [name] is a  60 year old lady with history of hematochezia  associated with rectal pain. She may have an anorectal fissure based on  clinical symptoms. Her family history is significant for colon cancer in a  brother at age 58. She reports remote colonoscopy. She needs to have a  colonoscopy for diagnostic as well as high risk surveillance purposes. She  also complains of constipation. I discussed risks, alternatives and benefits  of colonoscopy with patient. She is agreeable to proceed.   PLAN:  1.  Colonoscopy in near future.  2.  Given right knee replacement 8 months ago we will go ahead and cover her      with antibiotics in the way of gentamicin and ampicillin.  3.  Begin MiraLax 17 g nightly p.r.n. constipation #527 g with 5 refills.     Tana Coast, P.AJonathon Bellows, M.D.  Electronically Signed   LL/MEDQ  D:  07/30/2005  T:  07/30/2005  Job:  [id number]   cc:   Vania Rea, M.D.

## 2010-11-23 NOTE — Consult Note (Signed)
NAME:  [name] [name], [name] [name]                  ACCOUNT NO.:  [id number]   MEDICAL RECORD NO.:  [id number]          PATIENT TYPE:  EMS   LOCATION:  ED                            FACILITY:  [facility]   PHYSICIAN:  Vania Rea, M.D. DATE OF BIRTH:  02-10-1951   DATE OF CONSULTATION:  06/25/2005  DATE OF DISCHARGE:                                   CONSULTATION   PRIMARY CARE PHYSICIAN:  Dr. Letitia Libra from [facility].   PHYSICIAN REQUESTING CONSULTATION:  Chauncy Passy, M.D., emergency  room physician.   REASON FOR CONSULTATION:  Rectal pain and bleeding.   CONSULTING PHYSICIAN:  Vania Rea, M.D.   IMPRESSION:  1.  History of acute rectal bleeding.  2.  Tender rectal examination without any evidence of  acute bleeding.  3.  No evidence of  anemia.  4.  No evidence of  orthostasis, likely local rectal problem.  5.  Family history of colon cancers.  6.  Dermatitis anterior chest.   RECOMMENDATIONS:  1.  CT scan of pelvis now, with p.o. and IV contrast.  2.  Follow up with Dr. Karilyn Cota or Dr. Jena Gauss as an outpatient for colonoscopy.  3.  Return to the emergency room for heavy bleeding.  4.  Hydrocortisone cream for eczema of chest.   HISTORY OF PRESENT ILLNESS:  This is a 60 year old African-American lady  with a history of hypertension and strong family history of colon cancer who  had colonoscopy with removal of rectal polyps about 7 years ago by Dr. Harold [name] in [place], who suffers with chronic constipation for which she  takes stool softeners and whose last bowel movement was 2 days ago and who  says since yesterday she has been having stool urgency but passing only some  small amounts of bright red blood when she goes to the bathroom.  The  patient says earlier today she felt dizzy and decided to come to the  emergency room tonight.  She also says she has been having a slight amount  of lower abdominal pain but most of the pain appears to be in her  rectum.  She is having no fever, no chest pains, no shortness of breath, and no  dizziness.  She is having no diarrhea or vomiting.  She denies any black  stool or any hematochezia.   PAST MEDICAL HISTORY:  1.  Hypertension.  2.  Pain in the right knee, status post right knee replacement about a year      ago by Dr. Orvan Falconer in [place].   MEDICATIONS:  1.  Atacand  daily.  2.  Calcium gluconate twice daily.  3.  Stool softeners.  4.  Ended a course of Flexeril and steroids for joint pains about a week      ago.   ALLERGIES:  LATEX.   SOCIAL HISTORY:  Denies tobacco, alcohol, or illicit drug use.   FAMILY HISTORY:  Significant for diabetes and hypertension.  Also colon  cancer in a brother and the father and lung cancer in another brother.   REVIEW OF SYSTEMS:  A 10-point review of systems reveals only rash on the  front of her chest for the past week, the etiology of which is unclear to  her.   PHYSICAL EXAMINATION:  GENERAL:  Pleasant middle-aged African-American lady  lying in bed in no distress.  Vitals:  Her temperature is 97.4, her pulse is  67, respirations 20, blood pressure 118/70.  She is saturating 100%.  HEENT:  Pupils are round, equal and reactive.  Mucous membranes are pink and  anicteric.  She has no oral candida.  NECK:  No lymphadenopathy, no thyromegaly.  CHEST: She has eczematous type rash over the front of her chest just below  her neck.  Her chest is clear to auscultation  bilaterally.  CARDIOVASCULAR  SYSTEM:  Regular rhythm without murmur.  ABDOMEN:  Was obese, soft, and nontender.  EXTREMITIES:  She has trace edema bilaterally.  She has evidence of  right  scar from a right knee replacement.  Her pulses are 3+ bilaterally.  CENTRAL  NERVOUS SYSTEM:  Cranial nerves II-XII are grossly intact.  She has no focal  neurological deficit.  RECTAL EXAM:  The vault is empty.  The anus seems somewhat tight.  The exam  seems to be very painful for her but there  was no evidence of  fissure on  inspection.  There was no blood back on the glove and there was a scant  amount of brown stool on the glove.   LABORATORIES:  Her white count is 7.6,  hemoglobin 12.7, MCV 92, platelets  202.  She has a normal differential on her white count.  Her PT is 13.1, her  PTT is 30.  Her sodium is 139, potassium 3.8, chloride 105, CO2 26, glucose  105, BUN 19, creatinine 1.2, calcium 9.0.      Vania Rea, M.D.  Electronically Signed     LC/MEDQ  D:  06/25/2005  T:  06/25/2005  Job:  [id number]   cc:   Lionel December, M.D.  [address]  [place]  [place] [id number]   R. Roetta Sessions, M.D.  [address]  [place]  [place] [address]

## 2010-11-23 NOTE — Op Note (Signed)
NAME:  [name] [name], [name] [name]                  ACCOUNT NO.:  [id number]   MEDICAL RECORD NO.:  [id number]          PATIENT TYPE:  AMB   LOCATION:  DAY                           FACILITY:  [facility]   PHYSICIAN:  R. Roetta Sessions, M.D. DATE OF BIRTH:  1951/06/19   DATE OF PROCEDURE:  08/02/2005  DATE OF DISCHARGE:                                 OPERATIVE REPORT   PROCEDURE:  Colonoscopy with biopsy, snare polypectomy, lesion ablation.   ENDOSCOPIST:  Gerrit Friends. Rourk, M.D.   INDICATIONS FOR PROCEDURE:  The patient is a 60 year old lady with  intermittent hematochezia and some rectal pain in a setting of chronic  constipation.  She has a positive family history of colon cancer.  She has a  distant history of having polyps removed.  She was started on MiraLax  through the office recently.  This has been associated with resolution of  rectal bleeding, pain, and constipation.  Colonoscopy is now being done.  This approach has been discussed with the patient at length.  The potential  risks benefits and alternatives have been reviewed; questions answered.  Please see the documentation in the medical record.   PROCEDURE NOTE:  O2 saturation, blood pressure, pulse and respirations were  monitored throughout the entire procedure.  SB prophylaxis prior to the  procedure were given to protect the recent placement of a prosthetic joint.  Ampicillin 2 gm IV, gentamicin 120 mg IV.   CONSCIOUS SEDATION:  Versed 3 mg IV, Demerol 75 mg in divided doses.   INSTRUMENT:  Olympus video chip system.   FINDINGS:  Digital rectal exam revealed no abnormalities.   ENDOSCOPIC FINDINGS:  Prep was suboptimal.   RECTUM:  Examination of the rectal mucosa including the retroflex view of  the anal verge revealing only internal hemorrhoids.   COLON:  The colonic mucosa was surveyed from the rectosigmoid junction  through the left transverse, right colon, to the area of the appendiceal  orifice, ileocecal valve, and cecum.   These structures were well seen and  photographed for the record.   From this level the scope was slowly withdrawn.  All previously mentioned  mucosal surfaces were again seen.  There was quite a bit of stool residue  throughout the mid-and-right colon for which the mucosa had to be washed and  suctioned multiple times.  The patient was noted to have a 3-mm polyp at the  hepatic flexure which was cold biopsied/removed.  There was a 6-mm polyp at  30 cm in the sigmoid colon which was removed with snare cautery.  There was  an adjacent 3-mm polyp which was ablated with the tip of the snare cautery  unit.  Otherwise the colonic mucosa appeared normal.  The patient tolerated  the procedure well and was reacted in endoscopy.  Cecal withdrawal time 10  minutes.   IMPRESSION:  1.  Internal hemorrhoids, otherwise normal rectum.  2.  Polyps at 30 cm removed and/or ablated as described above.  3.  Diminutive polyp at the hepatic flexure cold biopsied/removed.  The      remainder of  the colonic mucosa appeared normal.   RECOMMENDATIONS:  1.  No aspirin or arthritis medications for the next 10 days.  2.  Hemorrhoid and diverticulosis literature provided to the patient.  3.  She has done remarkably well with the MiraLax.  We will continue that      regimen on a p.r.n. basis.  4.  Followup on path.  5.  Further recommendations to follow.      Jonathon Bellows, M.D.  Electronically Signed     RMR/MEDQ  D:  08/02/2005  T:  08/02/2005  Job:  [id number]   cc:   Hospitalist Service at [facility]   Vania Rea, M.D.

## 2011-03-10 ENCOUNTER — Emergency Department (HOSPITAL_COMMUNITY)
Admission: EM | Admit: 2011-03-10 | Discharge: 2011-03-10 | Disposition: A | Discharge status: Home / Self Care | Payer: Medicaid Other | Attending: Emergency Medicine | Admitting: Emergency Medicine

## 2011-03-10 ENCOUNTER — Emergency Department (HOSPITAL_COMMUNITY): Payer: Medicaid Other

## 2011-03-10 ENCOUNTER — Encounter (HOSPITAL_COMMUNITY): Payer: Self-pay | Admitting: *Deleted

## 2011-03-10 DIAGNOSIS — Z860100 Personal history of colon polyps, unspecified: Secondary | ICD-10-CM | POA: Insufficient documentation

## 2011-03-10 DIAGNOSIS — J3489 Other specified disorders of nose and nasal sinuses: Secondary | ICD-10-CM | POA: Insufficient documentation

## 2011-03-10 DIAGNOSIS — I129 Hypertensive chronic kidney disease with stage 1 through stage 4 chronic kidney disease, or unspecified chronic kidney disease: Secondary | ICD-10-CM | POA: Insufficient documentation

## 2011-03-10 DIAGNOSIS — IMO0001 Reserved for inherently not codable concepts without codable children: Secondary | ICD-10-CM | POA: Insufficient documentation

## 2011-03-10 DIAGNOSIS — J069 Acute upper respiratory infection, unspecified: Secondary | ICD-10-CM

## 2011-03-10 DIAGNOSIS — N189 Chronic kidney disease, unspecified: Secondary | ICD-10-CM | POA: Insufficient documentation

## 2011-03-10 DIAGNOSIS — R509 Fever, unspecified: Secondary | ICD-10-CM | POA: Insufficient documentation

## 2011-03-10 DIAGNOSIS — J320 Chronic maxillary sinusitis: Secondary | ICD-10-CM

## 2011-03-10 DIAGNOSIS — R070 Pain in throat: Secondary | ICD-10-CM | POA: Insufficient documentation

## 2011-03-10 DIAGNOSIS — R51 Headache: Secondary | ICD-10-CM | POA: Insufficient documentation

## 2011-03-10 MED ORDER — ACETAMINOPHEN 500 MG PO TABS
1000.0000 mg | ORAL_TABLET | Freq: Once | ORAL | Status: AC
  Administered 2011-03-10: 1000 mg via ORAL
  Filled 2011-03-10: qty 2

## 2011-03-10 MED ORDER — AMOXICILLIN-POT CLAVULANATE 875-125 MG PO TABS: 1.0000 | ORAL_TABLET | Freq: Two times a day (BID) | ORAL | Status: AC

## 2011-03-10 NOTE — Discharge Instructions (Signed)
Use the antibiotic as prescribed until gone.  I also suggest using a decongestant - look for Coricidin at the drug store - this is safe to take with your high blood pressure.  Sinusitis Sinuses are air pockets within the bones of your face. The growth of bacteria within a sinus leads to infection. Infection keeps the sinuses from draining. This infection is called sinusitis. SYMPTOMS There will be different areas of pain depending on which sinuses have become infected.  The maxillary sinuses often produce pain beneath the eyes.   Frontal sinusitis may cause pain in the middle of the forehead and above the eyes.  Other problems (symptoms) include:  Toothaches.   Colored, pus-like (purulent) drainage from the nose.   Any swelling, warmth, or tenderness over the sinus areas may be signs of infection.  TREATMENT Sinusitis is most often determined by an exam and you may have x-rays taken. If x-rays have been taken, make sure you obtain your results. Or find out how you are to obtain them. Your caregiver may give you medications (antibiotics). These are medications that will help kill the infection. You may also be given a medication (decongestant) that helps to reduce sinus swelling.  HOME CARE INSTRUCTIONS  Only take over-the-counter or prescription medicines for pain, discomfort, or fever as directed by your caregiver.   Drink extra fluids. Fluids help thin the mucus so your sinuses can drain more easily.   Applying either moist heat or ice packs to the sinus areas may help relieve discomfort.   Use saline nasal sprays to help moisten your sinuses. The sprays can be found at your local drugstore.  SEEK IMMEDIATE MEDICAL CARE IF YOU DEVELOP:  High fever that is still present after two days of antibiotic treatment.   Increasing pain, severe headaches, or toothache.   Nausea, vomiting, or drowsiness.   Unusual swelling around the face or trouble seeing.  MAKE SURE YOU:   Understand  these instructions.   Will watch your condition.   Will get help right away if you are not doing well or get worse.  Document Released: 06/24/2005 Document Re-Released: 06/06/2008 [facility] Patient Information 2011 [place], [place].

## 2011-03-10 NOTE — ED Provider Notes (Signed)
Medical screening examination/treatment/procedure(s) were performed by non-physician practitioner and as supervising physician I was immediately available for consultation/collaboration.   Shelda Jakes, MD 03/10/11 1007

## 2011-03-10 NOTE — ED Notes (Signed)
Pt c/o sore throat, bilateral earache runny nose, aching all over and cough x 3 days.

## 2011-03-10 NOTE — ED Provider Notes (Signed)
History     CSN: [id number] Arrival date & time: 03/10/2011  8:36 AM  Chief Complaint  Patient presents with  . Sore Throat   HPI Comments: Cough has been productive of thick green sputum.  Her nasal discharge has been thick  But clear to white.  Occasional blood tinge.  Patient is a 60 y.o. female presenting with pharyngitis. The history is provided by the patient.  Sore Throat This is a new problem. The current episode started in the past 7 days. The problem occurs constantly. The problem has been unchanged. Associated symptoms include congestion, coughing, a fever, headaches, myalgias and a sore throat. Pertinent negatives include no diaphoresis or rash. Associated symptoms comments: Sinus pain and nasal congestion . The symptoms are aggravated by coughing. She has tried acetaminophen and NSAIDs for the symptoms. The treatment provided mild relief.    Past Medical History  Diagnosis Date  . Depression   . Glucose intolerance (impaired glucose tolerance)   . Hyperlipidemia   . Hypertension   . Chronic renal insufficiency   . Adenomatous colon polyp 2007    Dr. Jena Gauss colonoscopy, internal hemorrhoids  . Diverticulosis 2010    Last colonoscopy by Dr. Jena Gauss 2/10, friable anal canal    Past Surgical History  Procedure Date  . Appendectomy   . Replacement total knee 2002    History reviewed. No pertinent family history.  History  Substance Use Topics  . Smoking status: Never Smoker   . Smokeless tobacco: Never Used  . Alcohol Use: No    OB History    Grav Para Term Preterm Abortions TAB SAB Ect Mult Living                  Review of Systems  Constitutional: Positive for fever. Negative for diaphoresis.  HENT: Positive for congestion, sore throat and rhinorrhea.   Respiratory: Positive for cough. Negative for chest tightness.   Cardiovascular: Negative.   Gastrointestinal: Negative.   Musculoskeletal: Positive for myalgias.  Skin: Negative for rash.  Neurological:  Positive for headaches.    Physical Exam  BP 131/79  Pulse 69  Temp(Src) 98.5 F (36.9 C) (Oral)  Resp 16  Wt 220 lb (99.791 kg)  SpO2 100%  Physical Exam  Nursing note and vitals reviewed. Constitutional: She is oriented to person, place, and time. She appears well-developed and well-nourished.  HENT:  Head: Normocephalic and atraumatic.  Right Ear: Hearing and tympanic membrane normal.  Left Ear: Hearing and tympanic membrane normal.  Nose: Mucosal edema and rhinorrhea present. Right sinus exhibits maxillary sinus tenderness. Left sinus exhibits maxillary sinus tenderness.  Mouth/Throat: Uvula is midline and oropharynx is clear and moist. Normal dentition. No dental abscesses. No oropharyngeal exudate, posterior oropharyngeal edema, posterior oropharyngeal erythema or tonsillar abscesses.  Eyes: Conjunctivae are normal.  Neck: Normal range of motion.  Cardiovascular: Normal rate, regular rhythm, normal heart sounds and intact distal pulses.   Pulmonary/Chest: Effort normal and breath sounds normal. She has no wheezes. She has no rales. She exhibits no tenderness.       Occasional rhonchi,  Clearing with cough.  Abdominal: Soft. Bowel sounds are normal. There is no tenderness.  Musculoskeletal: Normal range of motion.  Neurological: She is alert and oriented to person, place, and time.  Skin: Skin is warm and dry.  Psychiatric: She has a normal mood and affect.    ED Course  Procedures  MDM Suspect viral uri with evolving sinusitis.      Jola Baptist  [place], PA 03/10/11 1001

## 2011-04-09 LAB — URINALYSIS, ROUTINE W REFLEX MICROSCOPIC
Glucose, UA: NEGATIVE
Hgb urine dipstick: NEGATIVE
Specific Gravity, Urine: >1.03 — ABNORMAL HIGH

## 2011-04-09 LAB — BASIC METABOLIC PANEL
BUN: 10
BUN: 14
CO2: 28
Calcium: 9.2
Calcium: 9.3
Creatinine, Ser: 1.07
GFR calc non Af Amer: 53 — ABNORMAL LOW
GFR calc non Af Amer: 53 — ABNORMAL LOW
Glucose, Bld: 119 — ABNORMAL HIGH
Glucose, Bld: 120 — ABNORMAL HIGH
Sodium: 137
Sodium: 140

## 2011-04-09 LAB — CBC
HCT: 39.5
Hemoglobin: 12.2
Hemoglobin: 13.4
MCHC: 33.3
MCHC: 33.4
MCV: 93.4
MCV: 94
Platelets: 187
RBC: 3.92
RBC: 4.21
RDW: 14.2
RDW: 14.4
WBC: 11.3 — ABNORMAL HIGH

## 2011-04-09 LAB — LIPASE, BLOOD: Lipase: 22

## 2011-04-09 LAB — DIFFERENTIAL
Basophils Absolute: 0
Basophils Absolute: 0
Basophils Absolute: 0
Basophils Relative: 0
Basophils Relative: 0
Eosinophils Absolute: 0
Eosinophils Relative: 1
Lymphocytes Relative: 10 — ABNORMAL LOW
Lymphocytes Relative: 13
Monocytes Absolute: 0.6
Monocytes Absolute: 0.8
Monocytes Relative: 7
Neutro Abs: 10.3 — ABNORMAL HIGH
Neutro Abs: 8.4 — ABNORMAL HIGH
Neutrophils Relative %: 83 — ABNORMAL HIGH

## 2011-04-09 LAB — COMPREHENSIVE METABOLIC PANEL
AST: 24
BUN: 18
CO2: 29
Chloride: 102
Creatinine, Ser: 1.19
GFR calc non Af Amer: 47 — ABNORMAL LOW
Glucose, Bld: 126 — ABNORMAL HIGH
Total Bilirubin: 0.7

## 2011-06-27 ENCOUNTER — Encounter: Payer: Self-pay | Admitting: Cardiology

## 2011-10-08 ENCOUNTER — Ambulatory Visit (HOSPITAL_COMMUNITY): Payer: Medicaid Other | Admitting: Physical Therapy

## 2011-10-08 ENCOUNTER — Encounter: Payer: Self-pay | Admitting: Internal Medicine

## 2011-10-30 ENCOUNTER — Encounter: Payer: Self-pay | Admitting: Internal Medicine

## 2011-10-31 ENCOUNTER — Ambulatory Visit (INDEPENDENT_AMBULATORY_CARE_PROVIDER_SITE_OTHER): Payer: Medicaid Other | Admitting: Urgent Care

## 2011-10-31 ENCOUNTER — Encounter: Payer: Self-pay | Admitting: Urgent Care

## 2011-10-31 VITALS — BP 134/90 | HR 70 | Temp 98.0°F | Ht 68.0 in | Wt 228.6 lb

## 2011-10-31 DIAGNOSIS — Z800 Family history of malignant neoplasm of digestive organs: Secondary | ICD-10-CM

## 2011-10-31 DIAGNOSIS — Z860100 Personal history of colon polyps, unspecified: Secondary | ICD-10-CM

## 2011-10-31 DIAGNOSIS — K5909 Other constipation: Secondary | ICD-10-CM

## 2011-10-31 DIAGNOSIS — K921 Melena: Secondary | ICD-10-CM

## 2011-10-31 MED ORDER — HYDROCORTISONE ACETATE 25 MG RE SUPP: 25.0000 mg | Freq: Two times a day (BID) | RECTAL | Status: DC

## 2011-10-31 MED ORDER — PEG-KCL-NACL-NASULF-NA ASC-C 100 G PO SOLR: 1.0000 | Freq: Once | ORAL | Status: DC

## 2011-10-31 NOTE — Assessment & Plan Note (Signed)
Improved with OTC laxative. Patient's states she did well with Benefiber, but has been unable to find it. [name] Fanning, LPN called to pharmacy and they do have generic equivalent.  Resume generic Benefiber

## 2011-10-31 NOTE — Patient Instructions (Signed)
You need a colonoscopy with Dr. Jena Gauss Continue generic Benefiber for your constipation Anusol suppositories twice daily for 10 days Call if you have any further problems

## 2011-10-31 NOTE — Progress Notes (Signed)
Faxed to PCP

## 2011-10-31 NOTE — Progress Notes (Signed)
Primary Care Physician:  Forest Gleason, MD, MD Primary Gastroenterologist:  Dr. Jena Gauss  Chief Complaint  Patient presents with  . Rectal Bleeding    bright red    HPI:  [name] [name] is a 61 y.o. female here for rectal bleeding. She has history of internal hemorrhoids and friable anal canal along with adenomatous colon polyps. She has been treated over the last several years for her hemorrhoids. She has a family history of colon cancer in her father and brother, but cannot remember ages. She has had several episodes of large-volume bright rectal bleeding with clots "like a period". She tells me her constipation is much better and she's been taking an over-the-counter laxative. He denies any upper bowel pain. She does take an occasional ibuprofen for back pain. Her last colonoscopy was over 3 years ago. he tried Citrucel for constipation but this did not help. She denies abdominal pain. Denies any upper GI symptoms including heartburn, indigestion, nausea, vomiting, dysphagia, odynophagia or anorexia.   Past Medical History  Diagnosis Date  . Depression   . Glucose intolerance (impaired glucose tolerance)   . Hyperlipidemia   . Hypertension   . Chronic renal insufficiency   . Adenomatous colon polyp 2007  . Diverticulosis 2010    Past Surgical History  Procedure Date  . Appendectomy   . Replacement total knee 2002  . Ileocolonoscopy 08/22/2008    Rourk- Friable anal canal, otherwise normal-appearing rectal mucosa, early shallow left-sided diverticula, colonic mucosa, and  terminal ileal mucosa appeared normal.   . Colonoscopy 08/02/2005    Rourk-Internal hemorrhoids, otherwise normal rectum. adenoma    Current Outpatient Prescriptions  Medication Sig Dispense Refill  . lisinopril (PRINIVIL,ZESTRIL) 5 MG tablet Take 5 mg by mouth daily.        . polyethylene glycol powder (MIRALAX) powder Take 17 g by mouth daily. Take 1 capful nightly as needed for constipation       . Wheat Dextrin  (BENEFIBER) POWD Take by mouth 1 dose over 46 hours.        . hydrocortisone (ANUSOL-HC) 25 MG suppository Place 1 suppository (25 mg total) rectally every 12 (twelve) hours.  20 suppository  0  . peg 3350 powder (MOVIPREP) SOLR Take 1 kit (100 g total) by mouth once. As directed Please purchase 1 Fleets enema to use with the prep  1 kit  0    Allergies as of 10/31/2011 - Review Complete 10/31/2011  Allergen Reaction Noted  . Ketorolac tromethamine Anaphylaxis   . Latex Hives    Family History  Problem Relation Age of Onset  . Colon cancer Brother   . Colon cancer Father   . Lung cancer Brother      History   Social History  . Marital Status: Legally Separated    Spouse Name: N/A    Number of Children: N/A  . Years of Education: N/A   Occupational History  . Not on file.   Social History Main Topics  . Smoking status: Never Smoker   . Smokeless tobacco: Never Used  . Alcohol Use: No  . Drug Use: No  . Sexually Active: Not Currently -- Female partner(s)    Birth Control/ Protection: None     boyfriend  Review of Systems: Gen: Denies any fever, chills, sweats, anorexia, fatigue, weakness, malaise, weight loss, and sleep disorder CV: Denies chest pain, angina, palpitations, syncope, orthopnea, PND, peripheral edema, and claudication. Resp: Denies dyspnea at rest, dyspnea with exercise, cough, sputum, wheezing, coughing up  blood, and pleurisy. GI: Denies vomiting blood, jaundice, and fecal incontinence.    GU : Denies urinary burning, blood in urine, urinary frequency, urinary hesitancy, nocturnal urination, and urinary incontinence. MS: Chronic low back pain.  Derm: Denies rash, itching, dry skin, hives, moles, warts, or unhealing ulcers.  Psych: Denies depression, anxiety, memory loss, suicidal ideation, hallucinations, paranoia, and confusion. Heme: Denies bruising or enlarged lymph nodes. Neuro:  Denies any headaches, dizziness, paresthesias. Endo:  Denies any problems  with DM, thyroid, adrenal function.  Physical Exam: BP 134/90  Pulse 70  Temp(Src) 98 F (36.7 C) (Temporal)  Ht 5\' 8"  (1.727 m)  Wt 228 lb 9.6 oz (103.692 kg)  BMI 34.76 kg/m2 General:   Alert,  Well-developed, well-nourished, pleasant and cooperative in NAD. Head:  Normocephalic and atraumatic. Eyes:  Sclera clear, no icterus.   Conjunctiva pink. Ears:  Normal auditory acuity. Nose:  No deformity, discharge, or lesions. Mouth:  No deformity or lesions,oropharynx pink & moist. Neck:  Supple; no masses or thyromegaly. Lungs:  Clear throughout to auscultation.   No wheezes, crackles, or rhonchi. No acute distress. Heart:  Regular rate and rhythm; no murmurs, clicks, rubs,  or gallops. Abdomen:  Normal bowel sounds.  No bruits.  Soft, non-tender and non-distended without masses, hepatosplenomegaly or hernias noted.  No guarding or rebound tenderness.   Rectal:  Deferred. Msk:  Symmetrical without gross deformities. Normal posture. Pulses:  Normal pulses noted. Extremities:  No clubbing or edema. Neurologic:  Alert and  oriented x4;  grossly normal neurologically. Skin:  Intact without significant lesions or rashes. Lymph Nodes:  No significant cervical adenopathy. Psych:  Alert and cooperative. Normal mood and affect.

## 2011-10-31 NOTE — Assessment & Plan Note (Addendum)
[name] [name] is a pleasant 61 y.o. female intermittent large-volume hematochezia. She has a personal history of adenomatous colon polyps and a family history with 2 first-degree relatives with colon cancer. Colonoscopy for further evaluation with Dr. Jena Gauss.  She does have history of friable anorectum/hemorrhoids which could be the culprit, however her colonoscopy has been over 3 years ago and she should have complete evaluation to rule out colorectal carcinoma or polyp as the culprit as well. I have discussed risks & benefits which include, but are not limited to, bleeding, infection, perforation & drug reaction.  The patient agrees with this plan & written consent will be obtained.  Anusol-HC suppositories 1 per rectum twice a day

## 2011-11-06 DIAGNOSIS — D126 Benign neoplasm of colon, unspecified: Secondary | ICD-10-CM

## 2011-11-06 HISTORY — DX: Benign neoplasm of colon, unspecified: D12.6

## 2011-11-12 ENCOUNTER — Encounter (HOSPITAL_COMMUNITY): Payer: Self-pay

## 2011-11-13 MED ORDER — SODIUM CHLORIDE 0.45 % IV SOLN
Freq: Once | INTRAVENOUS | Status: AC
  Administered 2011-11-14: 1000 mL via INTRAVENOUS

## 2011-11-14 ENCOUNTER — Ambulatory Visit (HOSPITAL_COMMUNITY)
Admission: RE | Admit: 2011-11-14 | Discharge: 2011-11-14 | Disposition: A | Discharge status: Home Health | Payer: Medicaid Other | Source: Ambulatory Visit | Attending: Internal Medicine | Admitting: Internal Medicine

## 2011-11-14 ENCOUNTER — Encounter (HOSPITAL_COMMUNITY): Payer: Self-pay | Admitting: *Deleted

## 2011-11-14 ENCOUNTER — Encounter (HOSPITAL_COMMUNITY)
Admission: RE | Disposition: A | Discharge status: Home Health | Payer: Self-pay | Source: Ambulatory Visit | Attending: Internal Medicine

## 2011-11-14 DIAGNOSIS — D126 Benign neoplasm of colon, unspecified: Secondary | ICD-10-CM | POA: Insufficient documentation

## 2011-11-14 DIAGNOSIS — K5909 Other constipation: Secondary | ICD-10-CM

## 2011-11-14 DIAGNOSIS — K5730 Diverticulosis of large intestine without perforation or abscess without bleeding: Secondary | ICD-10-CM | POA: Insufficient documentation

## 2011-11-14 DIAGNOSIS — Z79899 Other long term (current) drug therapy: Secondary | ICD-10-CM | POA: Insufficient documentation

## 2011-11-14 DIAGNOSIS — K921 Melena: Secondary | ICD-10-CM

## 2011-11-14 DIAGNOSIS — E785 Hyperlipidemia, unspecified: Secondary | ICD-10-CM | POA: Insufficient documentation

## 2011-11-14 DIAGNOSIS — I10 Essential (primary) hypertension: Secondary | ICD-10-CM | POA: Insufficient documentation

## 2011-11-14 DIAGNOSIS — Z86010 Personal history of colonic polyps: Secondary | ICD-10-CM

## 2011-11-14 HISTORY — PX: COLONOSCOPY: SHX5424

## 2011-11-14 SURGERY — COLONOSCOPY
Anesthesia: Moderate Sedation

## 2011-11-14 MED ORDER — MEPERIDINE HCL 100 MG/ML IJ SOLN
INTRAMUSCULAR | Status: DC | PRN
  Administered 2011-11-14: 50 mg via INTRAVENOUS
  Administered 2011-11-14: 25 mg via INTRAVENOUS

## 2011-11-14 MED ORDER — MIDAZOLAM HCL 5 MG/5ML IJ SOLN
INTRAMUSCULAR | Status: AC
  Filled 2011-11-14: qty 10

## 2011-11-14 MED ORDER — MIDAZOLAM HCL 5 MG/5ML IJ SOLN
INTRAMUSCULAR | Status: DC | PRN
  Administered 2011-11-14 (×2): 2 mg via INTRAVENOUS

## 2011-11-14 MED ORDER — MEPERIDINE HCL 100 MG/ML IJ SOLN
INTRAMUSCULAR | Status: AC
  Filled 2011-11-14: qty 2

## 2011-11-14 MED ORDER — STERILE WATER FOR IRRIGATION IR SOLN
Status: DC | PRN
  Administered 2011-11-14: 10:00:00

## 2011-11-14 NOTE — Discharge Instructions (Signed)
Colonoscopy Discharge Instructions  Read the instructions outlined below and refer to this sheet in the next few weeks. These discharge instructions provide you with general information on caring for yourself after you leave the hospital. Your doctor may also give you specific instructions. While your treatment has been planned according to the most current medical practices available, unavoidable complications occasionally occur. If you have any problems or questions after discharge, call Dr. Jena Gauss at [phone]. ACTIVITY  You may resume your regular activity, but move at a slower pace for the next 24 hours.   Take frequent rest periods for the next 24 hours.   Walking will help get rid of the air and reduce the bloated feeling in your belly (abdomen).   No driving for 24 hours (because of the medicine (anesthesia) used during the test).    Do not sign any important legal documents or operate any machinery for 24 hours (because of the anesthesia used during the test).  NUTRITION  Drink plenty of fluids.   You may resume your normal diet as instructed by your doctor.   Begin with a light meal and progress to your normal diet. Heavy or fried foods are harder to digest and may make you feel sick to your stomach (nauseated).   Avoid alcoholic beverages for 24 hours or as instructed.  MEDICATIONS  You may resume your normal medications unless your doctor tells you otherwise.  WHAT YOU CAN EXPECT TODAY  Some feelings of bloating in the abdomen.   Passage of more gas than usual.   Spotting of blood in your stool or on the toilet paper.  IF YOU HAD POLYPS REMOVED DURING THE COLONOSCOPY:  No aspirin products for 7 days or as instructed.   No alcohol for 7 days or as instructed.   Eat a soft diet for the next 24 hours.  FINDING OUT THE RESULTS OF YOUR TEST Not all test results are available during your visit. If your test results are not back during the visit, make an appointment  with your caregiver to find out the results. Do not assume everything is normal if you have not heard from your caregiver or the medical facility. It is important for you to follow up on all of your test results.  SEEK IMMEDIATE MEDICAL ATTENTION IF:  You have more than a spotting of blood in your stool.   Your belly is swollen (abdominal distention).   You are nauseated or vomiting.   You have a temperature over 101.   You have abdominal pain or discomfort that is severe or gets worse throughout the day.   Polyp information provided.  Continue Benefiber every day  Further recommendations to follow pending review of pathology report.  Colon Polyps A polyp is extra tissue that grows inside your body. Colon polyps grow in the large intestine. The large intestine, also called the colon, is part of your digestive system. It is a [place], hollow tube at the end of your digestive tract where your body makes and stores stool. Most polyps are not dangerous. They are benign. This means they are not cancerous. But over time, some types of polyps can turn into cancer. Polyps that are smaller than a pea are usually not harmful. But larger polyps could someday become or may already be cancerous. To be safe, doctors remove all polyps and test them.  WHO GETS POLYPS? Anyone can get polyps, but certain people are more likely than others. You may have a greater chance of  getting polyps if:  You are over 50.   You have had polyps before.   Someone in your family has had polyps.   Someone in your family has had cancer of the large intestine.   Find out if someone in your family has had polyps. You may also be more likely to get polyps if you:   Eat a lot of fatty foods.   Smoke.   Drink alcohol.   Do not exercise.   Eat too much.  SYMPTOMS  Most small polyps do not cause symptoms. People often do not know they have one until their caregiver finds it during a regular checkup or while testing  them for something else. Some people do have symptoms like these:  Bleeding from the anus. You might notice blood on your underwear or on toilet paper after you have had a bowel movement.   Constipation or diarrhea that lasts more than a week.   Blood in the stool. Blood can make stool look black or it can show up as red streaks in the stool.  If you have any of these symptoms, see your caregiver. HOW DOES THE DOCTOR TEST FOR POLYPS? The doctor can use four tests to check for polyps:  Digital rectal exam. The caregiver wears gloves and checks your rectum (the last part of the large intestine) to see if it feels normal. This test would find polyps only in the rectum. Your caregiver may need to do one of the other tests listed below to find polyps higher up in the intestine.   Barium enema. The caregiver puts a liquid called barium into your rectum before taking x-rays of your large intestine. Barium makes your intestine look white in the pictures. Polyps are dark, so they are easy to see.   Sigmoidoscopy. With this test, the caregiver can see inside your large intestine. A thin flexible tube is placed into your rectum. The device is called a sigmoidoscope, which has a light and a tiny video camera in it. The caregiver uses the sigmoidoscope to look at the last third of your large intestine.   Colonoscopy. This test is like sigmoidoscopy, but the caregiver looks at all of the large intestine. It usually requires sedation. This is the most common method for finding and removing polyps.  TREATMENT   The caregiver will remove the polyp during sigmoidoscopy or colonoscopy. The polyp is then tested for cancer.   If you have had polyps, your caregiver may want you to get tested regularly in the future.  PREVENTION  There is not one sure way to prevent polyps. You might be able to lower your risk of getting them if you:  Eat more fruits and vegetables and less fatty food.   Do not smoke.    Avoid alcohol.   Exercise every day.   Lose weight if you are overweight.   Eating more calcium and folate can also lower your risk of getting polyps. Some foods that are rich in calcium are milk, cheese, and broccoli. Some foods that are rich in folate are chickpeas, kidney beans, and spinach.   Aspirin might help prevent polyps. Studies are under way.  Document Released: 03/20/2004 Document Revised: 06/13/2011 Document Reviewed: 08/26/2007 [facility] Patient Information 2012 [place], [place].

## 2011-11-14 NOTE — Op Note (Signed)
[facility] [address] [place], [place]  [address]  COLONOSCOPY PROCEDURE REPORT  PATIENT:  [name] [name], [name] [name]  MR#:  [id number] BIRTHDATE:  December 30, 1950, 60 yrs. old  GENDER:  female ENDOSCOPIST:  R. Roetta Sessions, MD FACP [facility] REF. BY:          Dr. Forest Gleason PROCEDURE DATE:  11/14/2011 PROCEDURE:  Ileo-colonoscopy with snare polypectomy and biopsy  INDICATIONS:  Recent hematochezia-resolved with course of Anusol suppositories and resumption of regular fiber supplement.  INFORMED CONSENT:  The risks, benefits, alternatives and imponderables including but not limited to bleeding, perforation as well as the possibility of a missed lesion have been reviewed. The potential for biopsy, lesion removal, etc. have also been discussed.  Questions have been answered.  All parties agreeable. Please see the history and physical in the medical record for more information.  MEDICATIONS:  Versed 4 mg IV and Demerol 75 mg IV in divided doses.  DESCRIPTION OF PROCEDURE:  After a digital rectal exam was performed, the EC-[id number] ([id number]) colonoscope was advanced from the anus through the rectum and colon to the area of the cecum, ileocecal valve and appendiceal orifice.  The cecum was deeply intubated.  These structures were well-seen and photographed for the record.  From the level of the cecum and ileocecal valve, the scope was slowly and cautiously withdrawn.  The mucosal surfaces were carefully surveyed utilizing scope tip deflection to facilitate fold flattening as needed.  The scope was pulled down into the rectum where a thorough examination including retroflexion was performed. <<PROCEDUREIMAGES>>  FINDINGS:  Good preparation. Friable anal canal; otherwise normal rectum. Very early shallow sigmoid diverticular changes. 4 mm polyp at the hepatic flexure. 2 diminutive polyps in the mid ascending segment; otherwise normal colonic mucosa. Normal distal 10 cm of terminal ileal  mucosa.  THERAPEUTIC / DIAGNOSTIC MANEUVERS PERFORMED:   The hepatic flexure polyp was cold snare removed. The diminutive ascending colon polyp was cold biopsy removed  COMPLICATIONS:  None  CECAL WITHDRAWAL TIME: 8 minutes  IMPRESSION: Friable anal rectum-likely source of hematochezia. Early sigmoid diverticular changes. Colonic polyps-removed as described above.  RECOMMENDATIONS:  Take  fiber supplement daily indefinitely. Followup on pathology.  ______________________________ R. Roetta Sessions, MD Caleen Essex  CC:  n. eSIGNED:   R. Roetta Sessions at 11/14/2011 10:44 AM  Sherryl Barters, [phone]

## 2011-11-15 ENCOUNTER — Ambulatory Visit: Payer: Medicaid Other | Admitting: Internal Medicine

## 2011-11-17 ENCOUNTER — Encounter: Payer: Self-pay | Admitting: Internal Medicine

## 2011-11-18 ENCOUNTER — Telehealth: Payer: Self-pay

## 2011-11-18 NOTE — Telephone Encounter (Signed)
T/C from Dr. Jena Gauss. He said pt should stay on clear liquid diet today. Resume the Anusol suppositories and complete the regimen for the hemorrhoids. She just had a couple of small polyps removed at colonoscopy. He doesn't feel the pain radiating to her right arm is coming from anything from the procedure. But if the pain does not subside or if it worsens she should go to the ED.   I called and informed pt.

## 2011-11-18 NOTE — Telephone Encounter (Signed)
Pt had colonoscopy on 11/14/2011. She has been doing well. One small BM on 11/17/2011. This AM she had a lot of abdominal pain, then she had a Small BM. She had light red blood in the stool and the light red blood covered the tissue paper when she wiped. She has hx of internal hemorrhoids. She still has 10 Anusol suppositories from her OV. She did not complete the regimen. She has used one since the last BM. She continues to have the abdominal pain which has been mostly constant and the pain radiates from her stomach to her right arm and back and forth. Please advise!

## 2011-11-20 ENCOUNTER — Encounter (HOSPITAL_COMMUNITY): Payer: Self-pay | Admitting: Internal Medicine

## 2012-04-03 ENCOUNTER — Emergency Department (HOSPITAL_COMMUNITY)
Admission: EM | Admit: 2012-04-03 | Discharge: 2012-04-03 | Disposition: A | Discharge status: Home / Self Care | Payer: Medicaid Other | Attending: Emergency Medicine | Admitting: Emergency Medicine

## 2012-04-03 ENCOUNTER — Encounter (HOSPITAL_COMMUNITY): Payer: Self-pay | Admitting: Emergency Medicine

## 2012-04-03 DIAGNOSIS — R51 Headache: Secondary | ICD-10-CM | POA: Insufficient documentation

## 2012-04-03 DIAGNOSIS — N189 Chronic kidney disease, unspecified: Secondary | ICD-10-CM | POA: Insufficient documentation

## 2012-04-03 DIAGNOSIS — E785 Hyperlipidemia, unspecified: Secondary | ICD-10-CM | POA: Insufficient documentation

## 2012-04-03 DIAGNOSIS — I129 Hypertensive chronic kidney disease with stage 1 through stage 4 chronic kidney disease, or unspecified chronic kidney disease: Secondary | ICD-10-CM | POA: Insufficient documentation

## 2012-04-03 MED ORDER — OXYCODONE-ACETAMINOPHEN 5-325 MG PO TABS: 1.0000 | ORAL_TABLET | ORAL | Status: DC | PRN

## 2012-04-03 NOTE — ED Notes (Signed)
MD at bedside. 

## 2012-04-03 NOTE — ED Notes (Signed)
Patient states pain began in her left ear and now c/o pain to left side of face.

## 2012-04-03 NOTE — ED Provider Notes (Signed)
History     CSN: [id number]  Arrival date & time 04/03/12  0002   First MD Initiated Contact with Patient 04/03/12 0105      Chief Complaint  Patient presents with  . Facial Pain    The history is provided by the patient.  facial pain Onset - yesterday Duration - intermittent Course - worsening Improved by - nothing Worsened by - nothing  Pt presents for left facial pain She reports it started as left ear pain and now radiates into left face.  She describes it as sharp/severe at times No facial trauma No dental pain.  No hearing loss.  No visual changes.  No fever/vomiting No focal arm/leg weakness No slurred speech or facial droop reported    Past Medical History  Diagnosis Date  . Depression   . Glucose intolerance (impaired glucose tolerance)   . Hyperlipidemia   . Hypertension   . Chronic renal insufficiency   . Adenomatous colon polyp 2007  . Diverticulosis 2010    Past Surgical History  Procedure Date  . Appendectomy   . Replacement total knee 2002  . Ileocolonoscopy 08/22/2008    Rourk- Friable anal canal, otherwise normal-appearing rectal mucosa, early shallow left-sided diverticula, colonic mucosa, and  terminal ileal mucosa appeared normal.   . Colonoscopy 08/02/2005    Rourk-Internal hemorrhoids, otherwise normal rectum. adenoma  . Colonoscopy 11/14/2011    Procedure: COLONOSCOPY;  Surgeon: Corbin Ade, MD;  Location: [facility];  Service: Endoscopy;  Laterality: N/A;  10:30    Family History  Problem Relation Age of Onset  . Colon cancer Brother   . Lung cancer Brother   . Colon cancer Father     History  Substance Use Topics  . Smoking status: Never Smoker   . Smokeless tobacco: Never Used  . Alcohol Use: No    OB History    Grav Para Term Preterm Abortions TAB SAB Ect Mult Living                  Review of Systems  Constitutional: Negative for fever.  Eyes: Negative for photophobia, pain, discharge, redness and visual  disturbance.  Respiratory: Negative for chest tightness and shortness of breath.   Cardiovascular: Negative for chest pain.  Gastrointestinal: Negative for abdominal pain.  Neurological: Negative for dizziness, facial asymmetry, speech difficulty, weakness and numbness.  All other systems reviewed and are negative.    Allergies  Ketorolac tromethamine and Latex  Home Medications   Current Outpatient Rx  Name Route Sig Dispense Refill  . FLUTICASONE PROPIONATE 50 MCG/ACT NA SUSP Nasal Place 2 sprays into the nose daily.    Marland Kitchen LISINOPRIL 5 MG PO TABS Oral Take 5 mg by mouth daily.     Marland Kitchen POLYETHYLENE GLYCOL 3350 PO POWD Oral Take 17 g by mouth daily. Take 1 capful nightly as needed for constipation    . HYDROCORTISONE ACETATE 25 MG RE SUPP Rectal Place 1 suppository (25 mg total) rectally every 12 (twelve) hours. 20 suppository 0  . OXYCODONE-ACETAMINOPHEN 5-325 MG PO TABS Oral Take 1 tablet by mouth every 4 (four) hours as needed for pain. 6 tablet 0  . OXYCODONE-ACETAMINOPHEN 5-325 MG PO TABS Oral Take 1 tablet by mouth every 4 (four) hours as needed for pain. 10 tablet 0  . PEG-KCL-NACL-NASULF-NA ASC-C 100 G PO SOLR Oral Take 1 kit (100 g total) by mouth once. As directed Please purchase 1 Fleets enema to use with the prep 1 kit 0  BP 155/91  Pulse 64  Temp 98.3 F (36.8 C) (Oral)  Resp 18  Ht 5\' 7"  (1.702 m)  Wt 230 lb (104.327 kg)  BMI 36.02 kg/m2  SpO2 100%  Physical Exam CONSTITUTIONAL: Well developed/well nourished HEAD AND FACE: Normocephalic/atraumatic EYES: EOMI/PERRL, no nystagmus, no conjunctival erythema,no ptosis ENMT: Mucous membranes moist, no dental abscess, no trismus, ears symmetric.  Left TM/right TM normal No erythema/edema to face NECK: supple no meningeal signs, no bruits SPINE:entire spine nontender CV: S1/S2 noted, no murmurs/rubs/gallops noted LUNGS: Lungs are clear to auscultation bilaterally, no apparent distress ABDOMEN: soft, nontender, no  rebound or guarding GU:no cva tenderness NEURO:Awake/alert, facies symmetric, no arm or leg drift is noted Cranial nerves 3/4/5/6/01/13/09/11/12 tested and intact Gait normal No past pointing EXTREMITIES: pulses normal, full ROM SKIN: warm, color normal PSYCH: no abnormalities of mood noted   ED Course  Procedures    1. Facial pain    Pt well appearing, smiling, no distress.  Could be having episodes of trigeminal neuralgia.   Pain meds, outpatient referral Stable for d/c    MDM  Nursing notes including past medical history and social history reviewed and considered in documentation         Joya Gaskins, MD 04/03/12 0320

## 2012-04-06 MED FILL — Oxycodone w/ Acetaminophen Tab 5-325 MG: ORAL | Qty: 6 | Status: AC

## 2012-06-27 ENCOUNTER — Encounter (HOSPITAL_COMMUNITY): Payer: Self-pay | Admitting: *Deleted

## 2012-06-27 ENCOUNTER — Emergency Department (HOSPITAL_COMMUNITY)
Admission: EM | Admit: 2012-06-27 | Discharge: 2012-06-28 | Disposition: A | Discharge status: Home / Self Care | Payer: Medicaid Other | Attending: Emergency Medicine | Admitting: Emergency Medicine

## 2012-06-27 DIAGNOSIS — Z860100 Personal history of colon polyps, unspecified: Secondary | ICD-10-CM | POA: Insufficient documentation

## 2012-06-27 DIAGNOSIS — K625 Hemorrhage of anus and rectum: Secondary | ICD-10-CM | POA: Insufficient documentation

## 2012-06-27 DIAGNOSIS — Z8659 Personal history of other mental and behavioral disorders: Secondary | ICD-10-CM | POA: Insufficient documentation

## 2012-06-27 DIAGNOSIS — I129 Hypertensive chronic kidney disease with stage 1 through stage 4 chronic kidney disease, or unspecified chronic kidney disease: Secondary | ICD-10-CM | POA: Insufficient documentation

## 2012-06-27 DIAGNOSIS — E785 Hyperlipidemia, unspecified: Secondary | ICD-10-CM | POA: Insufficient documentation

## 2012-06-27 DIAGNOSIS — K921 Melena: Secondary | ICD-10-CM | POA: Insufficient documentation

## 2012-06-27 DIAGNOSIS — N189 Chronic kidney disease, unspecified: Secondary | ICD-10-CM | POA: Insufficient documentation

## 2012-06-27 DIAGNOSIS — K5792 Diverticulitis of intestine, part unspecified, without perforation or abscess without bleeding: Secondary | ICD-10-CM

## 2012-06-27 DIAGNOSIS — K5732 Diverticulitis of large intestine without perforation or abscess without bleeding: Secondary | ICD-10-CM | POA: Insufficient documentation

## 2012-06-27 DIAGNOSIS — Z79899 Other long term (current) drug therapy: Secondary | ICD-10-CM | POA: Insufficient documentation

## 2012-06-27 MED ORDER — PANTOPRAZOLE SODIUM 40 MG IV SOLR
40.0000 mg | Freq: Once | INTRAVENOUS | Status: AC
  Administered 2012-06-28: 40 mg via INTRAVENOUS
  Filled 2012-06-27: qty 40

## 2012-06-27 NOTE — ED Provider Notes (Signed)
History   This chart was scribed for [employer]. Colon Branch, MD by Leone Payor, ED Scribe. This patient was seen in room [facility]09/[facility]09 and the patient's care was started at 2306.   CSN: [id number]  Arrival date & time 06/27/12  2220   First MD Initiated Contact with Patient 06/27/12 2306      Chief Complaint  Patient presents with  . Rectal Bleeding  . Abdominal Pain     The history is provided by the patient. No language interpreter was used.    [name] [name] is a 61 y.o. female with h/o diverticulitis and adenomatous colon polyp who presents to the Emergency Department complaining of unchanged rectal bleeding with associated epigastric abdominal pain starting 3 days ago.  Pt denies any nausea or vomiting. Pt denies h/o ulcers. PCP Dr. Christell Constant and GI doctor is Dr. Jena Gauss.  Past Medical History  Diagnosis Date  . Depression   . Glucose intolerance (impaired glucose tolerance)   . Hyperlipidemia   . Hypertension   . Chronic renal insufficiency   . Adenomatous colon polyp 2007  . Diverticulosis 2010    Past Surgical History  Procedure Date  . Appendectomy   . Replacement total knee 2002  . Ileocolonoscopy 08/22/2008    Rourk- Friable anal canal, otherwise normal-appearing rectal mucosa, early shallow left-sided diverticula, colonic mucosa, and  terminal ileal mucosa appeared normal.   . Colonoscopy 08/02/2005    Rourk-Internal hemorrhoids, otherwise normal rectum. adenoma  . Colonoscopy 11/14/2011    Procedure: COLONOSCOPY;  Surgeon: Corbin Ade, MD;  Location: [facility];  Service: Endoscopy;  Laterality: N/A;  10:30    Family History  Problem Relation Age of Onset  . Colon cancer Brother   . Lung cancer Brother   . Colon cancer Father     History  Substance Use Topics  . Smoking status: Never Smoker   . Smokeless tobacco: Never Used  . Alcohol Use: No    No OB history provided.   Review of Systems  Constitutional: Negative for fever.       10 Systems reviewed  and are negative for acute change except as noted in the HPI.  HENT: Negative for congestion.   Eyes: Negative for discharge and redness.  Respiratory: Negative for cough and shortness of breath.   Cardiovascular: Negative for chest pain.  Gastrointestinal: Positive for abdominal pain and blood in stool. Negative for vomiting.  Musculoskeletal: Negative for back pain.  Skin: Negative for rash.  Neurological: Negative for syncope, numbness and headaches.  Psychiatric/Behavioral:       No behavior change.   A complete 10 system review of systems was obtained and all systems are negative except as noted in the HPI and PMH.    Allergies  Ketorolac tromethamine and Latex  Home Medications   Current Outpatient Rx  Name  Route  Sig  Dispense  Refill  . FLUTICASONE PROPIONATE 50 MCG/ACT NA SUSP   Nasal   Place 2 sprays into the nose daily.         Marland Kitchen HYDROCORTISONE ACETATE 25 MG RE SUPP   Rectal   Place 1 suppository (25 mg total) rectally every 12 (twelve) hours.   20 suppository   0   . LISINOPRIL 5 MG PO TABS   Oral   Take 5 mg by mouth daily.            BP 146/79  Pulse 63  Temp 98 F (36.7 C)  Resp 20  Ht  5\' 7"  (1.702 m)  Wt 222 lb (100.699 kg)  BMI 34.77 kg/m2  SpO2 99%  Physical Exam  Nursing note and vitals reviewed. Constitutional: She appears well-developed and well-nourished.  HENT:  Head: Normocephalic and atraumatic.  Eyes: Conjunctivae normal are normal. Pupils are equal, round, and reactive to light.  Neck: Neck supple. No tracheal deviation present. No thyromegaly present.  Cardiovascular: Normal rate and regular rhythm.   No murmur heard. Pulmonary/Chest: Effort normal and breath sounds normal.  Abdominal: Soft. Bowel sounds are normal. She exhibits no distension. There is no tenderness.  Musculoskeletal: Normal range of motion. She exhibits no edema and no tenderness.  Neurological: She is alert. Coordination normal.  Skin: Skin is warm and  dry. No rash noted.  Psychiatric: She has a normal mood and affect.    ED Course  Procedures (including critical care time)  DIAGNOSTIC STUDIES: Oxygen Saturation is 99% on room air, normal by my interpretation.    COORDINATION OF CARE:   11:20 PM Discussed treatment plan which includes rectal exam with pt at bedside and pt agreed to plan. Results for orders placed during the hospital encounter of 06/27/12  CBC WITH DIFFERENTIAL      Component Value Range   WBC 7.0  4.0 - 10.5 K/uL   RBC 4.06  3.87 - 5.11 MIL/uL   Hemoglobin 12.7  12.0 - 15.0 g/dL   HCT 16.1  09.6 - 04.5 %   MCV 91.4  78.0 - 100.0 fL   MCH 31.3  26.0 - 34.0 pg   MCHC 34.2  30.0 - 36.0 g/dL   RDW 40.9  81.1 - 91.4 %   Platelets 191  150 - 400 K/uL   Neutrophils Relative 50  43 - 77 %   Neutro Abs 3.6  1.7 - 7.7 K/uL   Lymphocytes Relative 41  12 - 46 %   Lymphs Abs 2.9  0.7 - 4.0 K/uL   Monocytes Relative 5  3 - 12 %   Monocytes Absolute 0.4  0.1 - 1.0 K/uL   Eosinophils Relative 4  0 - 5 %   Eosinophils Absolute 0.3  0.0 - 0.7 K/uL   Basophils Relative 0  0 - 1 %   Basophils Absolute 0.0  0.0 - 0.1 K/uL  BASIC METABOLIC PANEL      Component Value Range   Sodium 136  135 - 145 mEq/L   Potassium 3.7  3.5 - 5.1 mEq/L   Chloride 103  96 - 112 mEq/L   CO2 25  19 - 32 mEq/L   Glucose, Bld 98  70 - 99 mg/dL   BUN 22  6 - 23 mg/dL   Creatinine, Ser 7.82  0.50 - 1.10 mg/dL   Calcium 9.5  8.4 - 95.6 mg/dL   GFR calc non Af Amer 59 (*) >90 mL/min   GFR calc Af Amer 68 (*) >90 mL/min      MDM  Patient with h/o diverticulitis here with lower abdominal pain and blood in the stool x 3 days, similar to previous episodes of diverticulitis. Labs unremarkable. Initiated antibiotic therapy. Patient to follow up with GI. Pt stable in ED with no significant deterioration in condition.The patient appears reasonably screened and/or stabilized for discharge and I doubt any other medical condition or other [facility] requiring  further screening, evaluation, or treatment in the ED at this time prior to discharge.  I personally performed the services described in this documentation, which was scribed in my presence.  The recorded information has been reviewed and considered.   MDM Reviewed: previous chart, nursing note and vitals Reviewed previous: labs and CT scan Interpretation: labs          Nicoletta Dress. Colon Branch, MD 07/03/12 1610

## 2012-06-27 NOTE — ED Notes (Addendum)
Pt c/o light brown blood coming from rectum, and abdominal pain x 3 days

## 2012-06-28 LAB — CBC WITH DIFFERENTIAL/PLATELET
Eosinophils Absolute: 0.3 10*3/uL (ref 0.0–0.7)
Eosinophils Relative: 4 % (ref 0–5)
HCT: 37.1 % (ref 36.0–46.0)
Lymphocytes Relative: 41 % (ref 12–46)
Lymphs Abs: 2.9 10*3/uL (ref 0.7–4.0)
MCH: 31.3 pg (ref 26.0–34.0)
MCV: 91.4 fL (ref 78.0–100.0)
Monocytes Absolute: 0.4 10*3/uL (ref 0.1–1.0)
RBC: 4.06 MIL/uL (ref 3.87–5.11)
WBC: 7 10*3/uL (ref 4.0–10.5)

## 2012-06-28 LAB — BASIC METABOLIC PANEL
BUN: 22 mg/dL (ref 6–23)
CO2: 25 mEq/L (ref 19–32)
Calcium: 9.5 mg/dL (ref 8.4–10.5)
Creatinine, Ser: 1.02 mg/dL (ref 0.50–1.10)
GFR calc non Af Amer: 59 mL/min — ABNORMAL LOW (ref >90)
Glucose, Bld: 98 mg/dL (ref 70–99)

## 2012-06-28 MED ORDER — HYDROCODONE-ACETAMINOPHEN 5-325 MG PO TABS: 1.0000 | ORAL_TABLET | ORAL | Status: AC | PRN

## 2012-06-28 MED ORDER — ONDANSETRON HCL 4 MG PO TABS: 4.0000 mg | ORAL_TABLET | Freq: Four times a day (QID) | ORAL | Status: DC

## 2012-06-28 MED ORDER — CIPROFLOXACIN HCL 250 MG PO TABS
500.0000 mg | ORAL_TABLET | Freq: Once | ORAL | Status: AC
  Administered 2012-06-28: 500 mg via ORAL
  Filled 2012-06-28: qty 2

## 2012-06-28 MED ORDER — METRONIDAZOLE 500 MG PO TABS: 500.0000 mg | ORAL_TABLET | Freq: Two times a day (BID) | ORAL | Status: DC

## 2012-06-28 MED ORDER — SODIUM CHLORIDE 0.9 % IV SOLN
Freq: Once | INTRAVENOUS | Status: AC
  Administered 2012-06-28: 20 mL/h via INTRAVENOUS

## 2012-06-28 MED ORDER — METRONIDAZOLE 500 MG PO TABS
500.0000 mg | ORAL_TABLET | Freq: Once | ORAL | Status: AC
  Administered 2012-06-28: 500 mg via ORAL
  Filled 2012-06-28: qty 1

## 2012-06-28 MED ORDER — CIPROFLOXACIN HCL 500 MG PO TABS: 500.0000 mg | ORAL_TABLET | Freq: Two times a day (BID) | ORAL | Status: DC

## 2012-06-28 NOTE — ED Notes (Signed)
Family at bedside. Patient states she does not need anything at this time. 

## 2012-07-13 ENCOUNTER — Emergency Department (HOSPITAL_COMMUNITY)
Admission: EM | Admit: 2012-07-13 | Discharge: 2012-07-13 | Disposition: A | Discharge status: Home / Self Care | Payer: Medicaid Other | Attending: Emergency Medicine | Admitting: Emergency Medicine

## 2012-07-13 ENCOUNTER — Encounter (HOSPITAL_COMMUNITY): Payer: Self-pay | Admitting: Emergency Medicine

## 2012-07-13 DIAGNOSIS — I129 Hypertensive chronic kidney disease with stage 1 through stage 4 chronic kidney disease, or unspecified chronic kidney disease: Secondary | ICD-10-CM | POA: Insufficient documentation

## 2012-07-13 DIAGNOSIS — Z79899 Other long term (current) drug therapy: Secondary | ICD-10-CM | POA: Insufficient documentation

## 2012-07-13 DIAGNOSIS — R112 Nausea with vomiting, unspecified: Secondary | ICD-10-CM

## 2012-07-13 DIAGNOSIS — Z8659 Personal history of other mental and behavioral disorders: Secondary | ICD-10-CM | POA: Insufficient documentation

## 2012-07-13 DIAGNOSIS — Z860100 Personal history of colon polyps, unspecified: Secondary | ICD-10-CM | POA: Insufficient documentation

## 2012-07-13 DIAGNOSIS — Z8719 Personal history of other diseases of the digestive system: Secondary | ICD-10-CM | POA: Insufficient documentation

## 2012-07-13 DIAGNOSIS — E785 Hyperlipidemia, unspecified: Secondary | ICD-10-CM | POA: Insufficient documentation

## 2012-07-13 DIAGNOSIS — N189 Chronic kidney disease, unspecified: Secondary | ICD-10-CM | POA: Insufficient documentation

## 2012-07-13 MED ORDER — ONDANSETRON HCL 4 MG PO TABS: 4.0000 mg | ORAL_TABLET | Freq: Four times a day (QID) | ORAL | Status: DC

## 2012-07-13 MED ORDER — ONDANSETRON HCL 4 MG/2ML IJ SOLN
4.0000 mg | Freq: Once | INTRAMUSCULAR | Status: AC
  Administered 2012-07-13: 4 mg via INTRAVENOUS
  Filled 2012-07-13: qty 2

## 2012-07-13 NOTE — ED Notes (Signed)
Pt c/o epigastric abdominal pain and NVD x2 hours. Pt denies chest pain, SOB. Pt reports "some" lightheadedness and weakness.

## 2012-07-14 ENCOUNTER — Other Ambulatory Visit: Payer: Self-pay | Admitting: Urgent Care

## 2012-07-14 NOTE — Telephone Encounter (Signed)
Please let pt know if she has continued problems with rectal bleeding, she will need an office visit. Thanks

## 2012-07-15 ENCOUNTER — Other Ambulatory Visit: Payer: Self-pay

## 2012-07-15 NOTE — Telephone Encounter (Signed)
Ginger, this was sent to pharm yesterday, please check on it. Thanks

## 2012-07-17 ENCOUNTER — Encounter: Payer: Self-pay | Admitting: Internal Medicine

## 2012-07-18 NOTE — ED Provider Notes (Signed)
History     61yf with n/v/d. Onset about 2 hours pta. Mild abdominal pain which started after vomiting. No fever ro chills. No urinary complaints. No sick contacts. No blood in stool or melena. No recent abx use or significant travel hx. No intervention pta.   CSN: [id number]  Arrival date & time 07/13/12  1732   First MD Initiated Contact with Patient 07/13/12 1807      Chief Complaint  Patient presents with  . Abdominal Pain    (Consider location/radiation/quality/duration/timing/severity/associated sxs/prior treatment) HPI  Past Medical History  Diagnosis Date  . Depression   . Glucose intolerance (impaired glucose tolerance)   . Hyperlipidemia   . Hypertension   . Chronic renal insufficiency   . Adenomatous colon polyp 2007  . Diverticulosis 2010    Past Surgical History  Procedure Date  . Appendectomy   . Replacement total knee 2002  . Ileocolonoscopy 08/22/2008    Rourk- Friable anal canal, otherwise normal-appearing rectal mucosa, early shallow left-sided diverticula, colonic mucosa, and  terminal ileal mucosa appeared normal.   . Colonoscopy 08/02/2005    Rourk-Internal hemorrhoids, otherwise normal rectum. adenoma  . Colonoscopy 11/14/2011    RMR: Friable anal rectum-likely source of hematochezia Early sigmoid diverticular changes. Colonic polyps-removed as described above    Family History  Problem Relation Age of Onset  . Colon cancer Brother   . Lung cancer Brother   . Colon cancer Father     History  Substance Use Topics  . Smoking status: Never Smoker   . Smokeless tobacco: Never Used  . Alcohol Use: No    OB History    Grav Para Term Preterm Abortions TAB SAB Ect Mult Living                  Review of Systems  All systems reviewed and negative, other than as noted in HPI.   Allergies  Ketorolac tromethamine and Latex  Home Medications   Current Outpatient Rx  Name  Route  Sig  Dispense  Refill  . LISINOPRIL 5 MG PO TABS   Oral  Take 5 mg by mouth daily.          . ANUCORT-HC 25 MG RE SUPP      INSERT (1) RECTALLY EVERY TWELVE HOURS.     -FOR RECTAL USE-   20 suppository   0   . FLUTICASONE PROPIONATE 50 MCG/ACT NA SUSP   Nasal   Place 2 sprays into the nose daily as needed. For congestion         . ONDANSETRON HCL 4 MG PO TABS   Oral   Take 1 tablet (4 mg total) by mouth every 6 (six) hours.   12 tablet   0     BP 128/101  Pulse 67  Temp 98.3 F (36.8 C) (Oral)  Resp 18  SpO2 100%  Physical Exam  Nursing note and vitals reviewed. Constitutional: She appears well-developed and well-nourished. No distress.  HENT:  Head: Normocephalic and atraumatic.  Eyes: Conjunctivae normal are normal. Right eye exhibits no discharge. Left eye exhibits no discharge.  Neck: Neck supple.  Cardiovascular: Normal rate, regular rhythm and normal heart sounds.  Exam reveals no gallop and no friction rub.   No murmur heard. Pulmonary/Chest: Effort normal and breath sounds normal. No respiratory distress.  Abdominal: Soft. She exhibits no distension. There is tenderness.       Mild epigastric tenderness w/o rebound or guarding.   Genitourinary:  No cva tenderness  Musculoskeletal: She exhibits no edema and no tenderness.  Neurological: She is alert.  Skin: Skin is warm and dry.  Psychiatric: She has a normal mood and affect. Her behavior is normal. Thought content normal.    ED Course  Procedures (including critical care time)  Labs Reviewed - No data to display No results found.   1. Nausea and vomiting in adult       MDM  61yF with n/v/d for only a few hours duration. Not peritoneal. Afebrile and HD stable. Plan symptomatic tx at this time. Likely viral illness. Low suspicion for emergent etiology and feel that w/u likely low yield and not indicated at this time. Emergent return precautions discussed.        Raeford Razor, MD 07/18/12 1500

## 2012-07-20 ENCOUNTER — Ambulatory Visit: Payer: Medicaid Other | Admitting: Urgent Care

## 2012-07-30 ENCOUNTER — Encounter: Payer: Self-pay | Admitting: Urgent Care

## 2012-07-30 ENCOUNTER — Ambulatory Visit (INDEPENDENT_AMBULATORY_CARE_PROVIDER_SITE_OTHER): Payer: Medicaid Other | Admitting: Urgent Care

## 2012-07-30 VITALS — BP 135/87 | HR 65 | Temp 98.2°F | Ht 66.0 in | Wt 221.2 lb

## 2012-07-30 DIAGNOSIS — R1110 Vomiting, unspecified: Secondary | ICD-10-CM | POA: Insufficient documentation

## 2012-07-30 DIAGNOSIS — Z800 Family history of malignant neoplasm of digestive organs: Secondary | ICD-10-CM

## 2012-07-30 DIAGNOSIS — K921 Melena: Secondary | ICD-10-CM

## 2012-07-30 DIAGNOSIS — D126 Benign neoplasm of colon, unspecified: Secondary | ICD-10-CM | POA: Insufficient documentation

## 2012-07-30 NOTE — Assessment & Plan Note (Signed)
Acute nausea & vomiting has resolved.  Suspect viral gastroenteritis.  Call if recurrent problems.

## 2012-07-30 NOTE — Progress Notes (Signed)
Referring Provider: Margorie John, MD Primary Care Physician:  Margorie John, MD Primary Gastroenterologist:  Dr. Jena Gauss  Chief Complaint  Patient presents with  . Rectal Bleeding    HPI:  [name] [name] is a 62 y.o. female here for follow up from ER visit 3 weeks ago.  She went to ER with acute nausea, vomiting & rectal bleeding that lasted 4 days.  She had been seeing light red blood in the toilet water with stools.  She denies constipation or diarrhea.  Denies nausea or vomiting.  She is back on Benefiber.  She was taking Ibuprofen 200mg  BID for 3 weeks, but stopped after she started bleeding.  Denies proctalgia or rectal pruritis.  +diaphoresis during episodes.  No ill contacts.   Denies heartburn, indigestion, nausea, vomiting, dysphagia, odynophagia or anorexia.  Wt loss of 7# in past 9 months.  She has been cutting back on meats, fried & fatty foods.  She has been walking & always tries to stay busy.  She had used Anusol suppositories given to her but only tried 2 & felt like she did not need it as bleeding had stopped.  Last colonoscopy by Dr Jena Gauss was in May 2013 & she had a tubular adenoma removed, early sigmoid diverticular changes & friable anorectum.  Hgb is normal.  She feels 100% better since ER visit & denies any further rectal bleeding.  Recent Results (from the past 1008 hour(s))  CBC WITH DIFFERENTIAL   Collection Time   06/28/12 12:31 AM      Component Value Range   WBC 7.0  4.0 - 10.5 K/uL   RBC 4.06  3.87 - 5.11 MIL/uL   Hemoglobin 12.7  12.0 - 15.0 g/dL   HCT 04.5  40.9 - 81.1 %   MCV 91.4  78.0 - 100.0 fL   MCH 31.3  26.0 - 34.0 pg   MCHC 34.2  30.0 - 36.0 g/dL   RDW 91.4  78.2 - 95.6 %   Platelets 191  150 - 400 K/uL   Neutrophils Relative 50  43 - 77 %   Neutro Abs 3.6  1.7 - 7.7 K/uL   Lymphocytes Relative 41  12 - 46 %   Lymphs Abs 2.9  0.7 - 4.0 K/uL   Monocytes Relative 5  3 - 12 %   Monocytes Absolute 0.4  0.1 - 1.0 K/uL   Eosinophils Relative 4  0 - 5  %   Eosinophils Absolute 0.3  0.0 - 0.7 K/uL   Basophils Relative 0  0 - 1 %   Basophils Absolute 0.0  0.0 - 0.1 K/uL  BASIC METABOLIC PANEL   Collection Time   06/28/12 12:31 AM      Component Value Range   Sodium 136  135 - 145 mEq/L   Potassium 3.7  3.5 - 5.1 mEq/L   Chloride 103  96 - 112 mEq/L   CO2 25  19 - 32 mEq/L   Glucose, Bld 98  70 - 99 mg/dL   BUN 22  6 - 23 mg/dL   Creatinine, Ser 2.13  0.50 - 1.10 mg/dL   Calcium 9.5  8.4 - 08.6 mg/dL   GFR calc non Af Amer 59 (*) >90 mL/min   GFR calc Af Amer 68 (*) >90 mL/min    Past Medical History  Diagnosis Date  . Depression   . Glucose intolerance (impaired glucose tolerance)   . Hyperlipidemia   . Hypertension   . Chronic renal  insufficiency   . Adenomatous colon polyp 2007  . Diverticulosis 2010  . Tubular adenoma of colon 11/2011    Past Surgical History  Procedure Date  . Appendectomy   . Replacement total knee 2002  . Ileocolonoscopy 08/22/2008    Rourk- Friable anal canal, otherwise normal-appearing rectal mucosa, early shallow left-sided diverticula, colonic mucosa, and  terminal ileal mucosa appeared normal.   . Colonoscopy 08/02/2005    Rourk-Internal hemorrhoids, otherwise normal rectum. adenoma  . Colonoscopy 11/14/2011    RMR: Friable anal rectum-likely source of hematochezia Early sigmoid diverticular changes. Colonic tubular adenoma removed     Current Outpatient Prescriptions  Medication Sig Dispense Refill  . fluticasone (FLONASE) 50 MCG/ACT nasal spray Place 2 sprays into the nose daily as needed. For congestion      . lisinopril (PRINIVIL,ZESTRIL) 5 MG tablet Take 5 mg by mouth daily.       . ondansetron (ZOFRAN) 4 MG tablet Take 1 tablet (4 mg total) by mouth every 6 (six) hours.  12 tablet  0    Allergies as of 07/30/2012 - Review Complete 07/30/2012  Allergen Reaction Noted  . Ketorolac tromethamine Anaphylaxis   . Latex Hives     Review of Systems: Gen: Denies any fever, chills,  sweats, anorexia, fatigue, weakness, malaise, weight loss, and sleep disorder CV: Denies chest pain, angina, palpitations, syncope, orthopnea, PND, peripheral edema, and claudication. Resp: Denies dyspnea at rest, dyspnea with exercise, cough, sputum, wheezing, coughing up blood, and pleurisy. GI: Denies vomiting blood, jaundice, and fecal incontinence. Derm: Denies rash, itching, dry skin, hives, moles, warts, or unhealing ulcers.  Psych: Denies depression, anxiety, memory loss, suicidal ideation, hallucinations, paranoia, and confusion. Heme: Denies bruising or enlarged lymph nodes.  Physical Exam: BP 135/87  Pulse 65  Temp 98.2 F (36.8 C) (Oral)  Ht 5\' 6"  (1.676 m)  Wt 221 lb 3.2 oz (100.336 kg)  BMI 35.70 kg/m2 General:   Alert,  Well-developed, well-nourished, pleasant and cooperative in NAD Eyes:  Sclera clear, no icterus.   Conjunctiva pink. Mouth:  No deformity or lesions, oropharynx pink and moist. Neck:  Supple; no masses or thyromegaly. Heart:  Regular rate and rhythm; no murmurs, clicks, rubs,  or gallops. Abdomen:  Normal bowel sounds.  No bruits.  Soft, non-tender and non-distended without masses, hepatosplenomegaly or hernias noted.  No guarding or rebound tenderness.   Rectal:  Deferred.  Msk:  Symmetrical without gross deformities.  Pulses:  Normal pulses noted. Extremities:  No clubbing or edema. Neurologic:  Alert and oriented x4;  grossly normal neurologically. Skin:  Intact without significant lesions or rashes.

## 2012-07-30 NOTE — Patient Instructions (Addendum)
Avoid ibuprofen, advil, aleve or other OTC anti-inflammatories Use Anusol suppositories you have if bleeding returns Next colonoscopy May 2018 Call if you have any other problems Great job with the weight loss.  Keep it up!!!

## 2012-07-30 NOTE — Assessment & Plan Note (Addendum)
Small volume hematochezia has resolved, suspect secondary to NSAID (Ibuprofen) use in setting of friable anorectum.  Hgb is normal so I doubt significant blood loss or diverticular bleeding.  Colonoscopy is up to date.  Avoid ibuprofen, advil, aleve or other OTC anti-inflammatories Use Anusol suppositories you have if bleeding returns Call if you have any other problems Great job with the weight loss.  Keep it up!!!

## 2012-07-30 NOTE — Progress Notes (Signed)
Faxed to PCP

## 2012-10-11 ENCOUNTER — Emergency Department (HOSPITAL_COMMUNITY)
Admission: EM | Admit: 2012-10-11 | Discharge: 2012-10-11 | Disposition: A | Discharge status: Home / Self Care | Payer: Medicaid Other | Attending: Emergency Medicine | Admitting: Emergency Medicine

## 2012-10-11 ENCOUNTER — Encounter (HOSPITAL_COMMUNITY): Payer: Self-pay | Admitting: Emergency Medicine

## 2012-10-11 ENCOUNTER — Emergency Department (HOSPITAL_COMMUNITY): Payer: Medicaid Other

## 2012-10-11 DIAGNOSIS — Z8639 Personal history of other endocrine, nutritional and metabolic disease: Secondary | ICD-10-CM | POA: Insufficient documentation

## 2012-10-11 DIAGNOSIS — S8990XA Unspecified injury of unspecified lower leg, initial encounter: Secondary | ICD-10-CM | POA: Insufficient documentation

## 2012-10-11 DIAGNOSIS — Z8719 Personal history of other diseases of the digestive system: Secondary | ICD-10-CM | POA: Insufficient documentation

## 2012-10-11 DIAGNOSIS — X500XXA Overexertion from strenuous movement or load, initial encounter: Secondary | ICD-10-CM | POA: Insufficient documentation

## 2012-10-11 DIAGNOSIS — Z96659 Presence of unspecified artificial knee joint: Secondary | ICD-10-CM | POA: Insufficient documentation

## 2012-10-11 DIAGNOSIS — Z79899 Other long term (current) drug therapy: Secondary | ICD-10-CM | POA: Insufficient documentation

## 2012-10-11 DIAGNOSIS — S99929A Unspecified injury of unspecified foot, initial encounter: Secondary | ICD-10-CM | POA: Insufficient documentation

## 2012-10-11 DIAGNOSIS — Z860100 Personal history of colon polyps, unspecified: Secondary | ICD-10-CM | POA: Insufficient documentation

## 2012-10-11 DIAGNOSIS — Y929 Unspecified place or not applicable: Secondary | ICD-10-CM | POA: Insufficient documentation

## 2012-10-11 DIAGNOSIS — Y939 Activity, unspecified: Secondary | ICD-10-CM | POA: Insufficient documentation

## 2012-10-11 DIAGNOSIS — M25561 Pain in right knee: Secondary | ICD-10-CM

## 2012-10-11 DIAGNOSIS — IMO0002 Reserved for concepts with insufficient information to code with codable children: Secondary | ICD-10-CM | POA: Insufficient documentation

## 2012-10-11 DIAGNOSIS — I129 Hypertensive chronic kidney disease with stage 1 through stage 4 chronic kidney disease, or unspecified chronic kidney disease: Secondary | ICD-10-CM | POA: Insufficient documentation

## 2012-10-11 DIAGNOSIS — N189 Chronic kidney disease, unspecified: Secondary | ICD-10-CM | POA: Insufficient documentation

## 2012-10-11 DIAGNOSIS — Z8659 Personal history of other mental and behavioral disorders: Secondary | ICD-10-CM | POA: Insufficient documentation

## 2012-10-11 DIAGNOSIS — Z862 Personal history of diseases of the blood and blood-forming organs and certain disorders involving the immune mechanism: Secondary | ICD-10-CM | POA: Insufficient documentation

## 2012-10-11 NOTE — ED Provider Notes (Signed)
History     CSN: [id number]  Arrival date & time 10/11/12  1205   First MD Initiated Contact with Patient 10/11/12 1250      Chief Complaint  Patient presents with  . Knee Pain    (Consider location/radiation/quality/duration/timing/severity/associated sxs/prior treatment) HPI [name] [name] is a 62 y.o. female who presents to the ED with knee pain. The pain is located in the right knee. The pain started 4 days ago. Patient with history of total knee replacement in 2005. Patient states she felt something pop and then back and the pain started after that.   Past Medical History  Diagnosis Date  . Depression   . Glucose intolerance (impaired glucose tolerance)   . Hyperlipidemia   . Hypertension   . Chronic renal insufficiency   . Adenomatous colon polyp 2007  . Diverticulosis 2010  . Tubular adenoma of colon 11/2011    Past Surgical History  Procedure Laterality Date  . Appendectomy    . Replacement total knee  2002  . Ileocolonoscopy  08/22/2008    Rourk- Friable anal canal, otherwise normal-appearing rectal mucosa, early shallow left-sided diverticula, colonic mucosa, and  terminal ileal mucosa appeared normal.   . Colonoscopy  08/02/2005    Rourk-Internal hemorrhoids, otherwise normal rectum. adenoma  . Colonoscopy  11/14/2011    RMR: Friable anal rectum-likely source of hematochezia Early sigmoid diverticular changes. Colonic tubular adenoma removed     Family History  Problem Relation Age of Onset  . Colon cancer Brother 50  . Lung cancer Brother   . Colon cancer Father 64  . Colon cancer Brother 21    History  Substance Use Topics  . Smoking status: Never Smoker   . Smokeless tobacco: Never Used  . Alcohol Use: No    OB History   Grav Para Term Preterm Abortions TAB SAB Ect Mult Living   3 2 1 1 1  1   2       Review of Systems  Constitutional: Negative for fever and activity change.  HENT: Negative for neck pain.   Cardiovascular: Negative for leg  swelling.  Musculoskeletal: Negative for back pain.       Right knee problems.  Skin: Negative for wound.  Neurological: Negative for weakness, numbness and headaches.  Psychiatric/Behavioral: Negative for confusion. The patient is not nervous/anxious.     Allergies  Ketorolac tromethamine and Latex  Home Medications   Current Outpatient Rx  Name  Route  Sig  Dispense  Refill  . fluticasone (FLONASE) 50 MCG/ACT nasal spray   Nasal   Place 2 sprays into the nose daily as needed. For congestion         . lisinopril (PRINIVIL,ZESTRIL) 5 MG tablet   Oral   Take 5 mg by mouth daily.          . ondansetron (ZOFRAN) 4 MG tablet   Oral   Take 1 tablet (4 mg total) by mouth every 6 (six) hours.   12 tablet   0     BP 139/95  Pulse 81  Temp(Src) 97.7 F (36.5 C) (Oral)  Resp 20  Ht 5' 6.5" (1.689 m)  Wt 214 lb (97.07 kg)  BMI 34.03 kg/m2  SpO2 95%  Physical Exam  Constitutional: She appears well-developed and well-nourished. No distress.  HENT:  Head: Normocephalic.  Eyes: EOM are normal.  Neck: Neck supple.  Cardiovascular: Normal rate.   Pulmonary/Chest: Effort normal.  Musculoskeletal: Normal range of motion.  Right knee: She exhibits normal range of motion, no swelling, no deformity and no laceration. No tenderness found.  There is no tenderness on exam. Full flexion and extension without difficulty. Pedal pulses present, good circulation and touch sensation.   Psychiatric: She has a normal mood and affect. Her behavior is normal. Judgment and thought content normal.    ED Course  Procedures (including critical care time)  Labs Reviewed - No data to display Dg Knee Complete 4 Views Right  10/11/2012  *RADIOLOGY REPORT*  Clinical Data: Placement 2005.  Pain.  RIGHT KNEE - COMPLETE 4+ VIEW  Comparison: None.  Findings: No complicating feature involving the prosthesis is observed.  There is a ossific structure along the distal quadriceps, query mild  heterotopic ossification.  Thickened appearing patellar tendon on the lateral projection.  IMPRESSION:  1.  No hardware complication observed. 2.  Thickened appearing patellar tendon on the lateral projection. 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   Original Report Authenticated By: Gaylyn Rong, M.D.      MDM  62 y.o. female with right knee problems. She denies pain at this time. Her concern is that it may be dislocating and then going back to normal. She no longer has an orthopedic doctor. We will place her in a knee immobilizer today and give her referral to Dr. Romeo Apple for follow up. I have reviewed this patient's vital signs, nurses notes, appropriate imaging and discussed findings and plan of care with the patient and she voices understanding.          [facility] Orlene Och, [place] 10/11/12 1425

## 2012-10-11 NOTE — ED Notes (Signed)
Patient c/o right knee pain x4 days. Per patient had total knee replacement in 2005. Per patient can "feel and see something pop out like a muscle" in which she can push it back in and "it pops out again shortly after."

## 2012-10-11 NOTE — ED Provider Notes (Signed)
Medical screening examination/treatment/procedure(s) were performed by non-physician practitioner and as supervising physician I was immediately available for consultation/collaboration.   Dione Booze, MD 10/11/12 1432

## 2014-05-09 ENCOUNTER — Encounter (HOSPITAL_COMMUNITY): Payer: Self-pay | Admitting: Emergency Medicine

## 2014-07-07 ENCOUNTER — Encounter (HOSPITAL_COMMUNITY): Payer: Self-pay | Admitting: *Deleted

## 2014-07-07 ENCOUNTER — Emergency Department (HOSPITAL_COMMUNITY)
Admission: EM | Admit: 2014-07-07 | Discharge: 2014-07-07 | Disposition: A | Discharge status: Home / Self Care | Payer: Medicaid Other | Attending: Emergency Medicine | Admitting: Emergency Medicine

## 2014-07-07 ENCOUNTER — Emergency Department (HOSPITAL_COMMUNITY): Payer: Medicaid Other

## 2014-07-07 DIAGNOSIS — Z8639 Personal history of other endocrine, nutritional and metabolic disease: Secondary | ICD-10-CM | POA: Diagnosis not present

## 2014-07-07 DIAGNOSIS — M25462 Effusion, left knee: Secondary | ICD-10-CM | POA: Insufficient documentation

## 2014-07-07 DIAGNOSIS — Z79899 Other long term (current) drug therapy: Secondary | ICD-10-CM | POA: Diagnosis not present

## 2014-07-07 DIAGNOSIS — Z8659 Personal history of other mental and behavioral disorders: Secondary | ICD-10-CM | POA: Insufficient documentation

## 2014-07-07 DIAGNOSIS — Z86010 Personal history of colonic polyps: Secondary | ICD-10-CM | POA: Insufficient documentation

## 2014-07-07 DIAGNOSIS — Z85038 Personal history of other malignant neoplasm of large intestine: Secondary | ICD-10-CM | POA: Insufficient documentation

## 2014-07-07 DIAGNOSIS — N189 Chronic kidney disease, unspecified: Secondary | ICD-10-CM | POA: Insufficient documentation

## 2014-07-07 DIAGNOSIS — I129 Hypertensive chronic kidney disease with stage 1 through stage 4 chronic kidney disease, or unspecified chronic kidney disease: Secondary | ICD-10-CM | POA: Diagnosis not present

## 2014-07-07 DIAGNOSIS — M1712 Unilateral primary osteoarthritis, left knee: Secondary | ICD-10-CM | POA: Diagnosis not present

## 2014-07-07 DIAGNOSIS — Z91040 Latex allergy status: Secondary | ICD-10-CM | POA: Insufficient documentation

## 2014-07-07 DIAGNOSIS — R52 Pain, unspecified: Secondary | ICD-10-CM

## 2014-07-07 DIAGNOSIS — M79605 Pain in left leg: Secondary | ICD-10-CM | POA: Diagnosis present

## 2014-07-07 MED ORDER — HYDROCODONE-ACETAMINOPHEN 5-325 MG PO TABS: 1.0000 | ORAL_TABLET | ORAL | Status: DC | PRN

## 2014-07-07 MED ORDER — HYDROCODONE-ACETAMINOPHEN 5-325 MG PO TABS
2.0000 | ORAL_TABLET | Freq: Once | ORAL | Status: DC
  Filled 2014-07-07: qty 2

## 2014-07-07 NOTE — ED Notes (Signed)
Pt alert & oriented x4, stable gait. Patient given discharge instructions, paperwork & prescription(s). Patient  instructed to stop at the registration desk to finish any additional paperwork. Patient verbalized understanding. Pt left department w/ no further questions. 

## 2014-07-07 NOTE — Discharge Instructions (Signed)
Take ibuprofen every 6 hours and Tylenol every 4 hours for pain as needed. Elevate and use ice regularly.  If you were given medicines take as directed.  If you are on coumadin or contraceptives realize their levels and effectiveness is altered by many different medicines.  If you have any reaction (rash, tongues swelling, other) to the medicines stop taking and see a physician.   Please follow up as directed and return to the ER or see a physician for new or worsening symptoms.  Thank you. Filed Vitals:   07/07/14 1815  BP: 159/76  Pulse: 68  Temp: 97.7 F (36.5 C)  TempSrc: Oral  Resp: 17  Height: 5\' 11"  (1.803 m)  Weight: 190 lb (86.183 kg)  SpO2: 100%   For severe pain take norco or vicodin however realize they have the potential for addiction and it can make you sleepy and has tylenol in it.  No operating machinery while taking.

## 2014-07-07 NOTE — ED Notes (Addendum)
L leg pain x 3 days.  States she can feel the patella "popping in and out".  Pain 10/10, radiating to hip and to feet.  Leg feels cold and numb. Walks w/limping gait.  Obvious difficulty rising from seated position.

## 2014-07-07 NOTE — ED Provider Notes (Signed)
CSN: [id number]     Arrival date & time 07/07/14  1812 History  This chart was scribed for Mariea Clonts, MD by Jeanell Sparrow, ED Scribe. This patient was seen in room APA12/APA12 and the patient's care was started at 7:04 PM.    Chief Complaint  Patient presents with  . Leg Pain   The history is provided by the patient. No language interpreter was used.    HPI Comments: [name] [name] is a 63 y.o. female with a hx of RA who presents to the Emergency Department complaining of constant moderate leg pain that started abut four days ago. She reports that her left knee has been popping and going in and out. She denies any recent injury. She reports a "warm and cold" sensation". She states that she took ibuprofen today without any relief..   Past Medical History  Diagnosis Date  . Depression   . Glucose intolerance (impaired glucose tolerance)   . Hyperlipidemia   . Hypertension   . Chronic renal insufficiency   . Adenomatous colon polyp 2007  . Diverticulosis 2010  . Tubular adenoma of colon 11/2011   Past Surgical History  Procedure Laterality Date  . Appendectomy    . Replacement total knee  2002  . Ileocolonoscopy  08/22/2008    Rourk- Friable anal canal, otherwise normal-appearing rectal mucosa, early shallow left-sided diverticula, colonic mucosa, and  terminal ileal mucosa appeared normal.   . Colonoscopy  08/02/2005    Rourk-Internal hemorrhoids, otherwise normal rectum. adenoma  . Colonoscopy  11/14/2011    RMR: Friable anal rectum-likely source of hematochezia Early sigmoid diverticular changes. Colonic tubular adenoma removed    Family History  Problem Relation Age of Onset  . Colon cancer Brother 6  . Lung cancer Brother   . Colon cancer Father 25  . Colon cancer Brother 67   History  Substance Use Topics  . Smoking status: Never Smoker   . Smokeless tobacco: Never Used  . Alcohol Use: No   OB History    Gravida Para Term Preterm AB TAB SAB Ectopic Multiple Living    3 2 1 1 1  1   2      Review of Systems  Musculoskeletal: Positive for myalgias and joint swelling.  All other systems reviewed and are negative.     Allergies  Ketorolac tromethamine and Latex  Home Medications   Prior to Admission medications   Medication Sig Start Date End Date Taking? Authorizing Provider  ibuprofen (ADVIL,MOTRIN) 200 MG tablet Take 200 mg by mouth every 6 (six) hours as needed for headache, mild pain or moderate pain.   Yes Historical Provider, MD  lisinopril (PRINIVIL,ZESTRIL) 5 MG tablet Take 5 mg by mouth daily.    Yes Historical Provider, MD  HYDROcodone-acetaminophen (NORCO) 5-325 MG per tablet Take 1-2 tablets by mouth every 4 (four) hours as needed. 07/07/14   Mariea Clonts, MD  ondansetron (ZOFRAN) 4 MG tablet Take 1 tablet (4 mg total) by mouth every 6 (six) hours. Patient not taking: Reported on 07/07/2014 07/13/12   Virgel Manifold, MD   BP 159/76 mmHg  Pulse 68  Temp(Src) 97.7 F (36.5 C) (Oral)  Resp 17  Ht 5\' 11"  (1.803 m)  Wt 190 lb (86.183 kg)  BMI 26.51 kg/m2  SpO2 100% Physical Exam  Constitutional: She is oriented to person, place, and time. She appears well-developed and well-nourished. No distress.  HENT:  Head: Normocephalic and atraumatic.  Eyes: EOM are normal. Pupils are  equal, round, and reactive to light. Right eye exhibits no discharge. Left eye exhibits no discharge. No scleral icterus.  Neck: Neck supple. No JVD present. No tracheal deviation present.  Cardiovascular: Normal rate.   Pulmonary/Chest: Effort normal. No respiratory distress.  Musculoskeletal: Normal range of motion.  No ankle TTP of effusion. No anterior Tibia TTP.   TTP to blilateral joint line in the left knee. Mild effusion. No significant warmth when compared to other knee. Pain with flexion. Pain with all testing. NO significant laxity with testing ([place] and [place]).   Neurological: She is alert and oriented to person, place, and time.  Skin: Skin is  warm and dry.  Psychiatric: She has a normal mood and affect. Her behavior is normal.  Nursing note and vitals reviewed.   ED Course  Procedures (including critical care time) DIAGNOSTIC STUDIES: Oxygen Saturation is 100% on RA, normal by my interpretation.    COORDINATION OF CARE: 7:08 PM- Pt advised of plan for treatment which includes medication and radiology and pt agrees.  Labs Review Labs Reviewed - No data to display  Imaging Review Dg Knee Complete 4 Views Left  07/07/2014   CLINICAL DATA:  Constant LEFT knee pain for 4 days. No injury. LEFT knee popping.  EXAM: LEFT KNEE - COMPLETE 4+ VIEW  COMPARISON:  None.  FINDINGS: Tricompartmental osteoarthritis is present, most severe in the patellofemoral compartment. There is a loose body in the anterior knee joint measuring 8 mm. No fracture. No effusion. No acute osseous abnormality.  IMPRESSION: Tricompartmental osteoarthritis with 8 mm loose body.   Electronically Signed   By: Dereck Ligas M.D.   On: 07/07/2014 19:50     EKG Interpretation None      MDM   Final diagnoses:  Pain  Primary osteoarthritis of left knee  Knee effusion, left   I personally performed the services described in this documentation, which was scribed in my presence. The recorded information has been reviewed and is accurate.]   Patient with left knee effusion mild left knee pain diffuse. Without significant injury no signs of septic joint concern for advanced arthritis. X-ray performed and reviewed showing significant arthritis. Pain medicines ordered and follow-up with orthopedics discussed.  Results and differential diagnosis were discussed with the patient/parent/guardian. Close follow up outpatient was discussed, comfortable with the plan.   Medications  HYDROcodone-acetaminophen (NORCO/VICODIN) 5-325 MG per tablet 2 tablet (not administered)    Filed Vitals:   07/07/14 1815  BP: 159/76  Pulse: 68  Temp: 97.7 F (36.5 C)  TempSrc:  Oral  Resp: 17  Height: 5\' 11"  (1.803 m)  Weight: 190 lb (86.183 kg)  SpO2: 100%    Final diagnoses:  Pain  Primary osteoarthritis of left knee  Knee effusion, left       Mariea Clonts, MD 07/07/14 2027

## 2014-07-07 NOTE — ED Notes (Signed)
Pt states her L. Knee has been popping itself out of place off and on for last four days. This is a new occurrence and pt just moves kneecap back in place. States she had some swelling to left lower extremity earlier today. Currently edema is minimal +1 and pedal pulses strong. Leg and foot remain warm to touch.

## 2014-07-21 ENCOUNTER — Ambulatory Visit (INDEPENDENT_AMBULATORY_CARE_PROVIDER_SITE_OTHER): Payer: Medicaid Other | Admitting: Orthopedic Surgery

## 2014-07-21 ENCOUNTER — Encounter: Payer: Self-pay | Admitting: Orthopedic Surgery

## 2014-07-21 VITALS — BP 136/81 | Ht 71.0 in | Wt 190.0 lb

## 2014-07-21 DIAGNOSIS — M5137 Other intervertebral disc degeneration, lumbosacral region: Secondary | ICD-10-CM

## 2014-07-21 DIAGNOSIS — Z96651 Presence of right artificial knee joint: Secondary | ICD-10-CM

## 2014-07-21 DIAGNOSIS — M1712 Unilateral primary osteoarthritis, left knee: Secondary | ICD-10-CM

## 2014-07-21 MED ORDER — IBUPROFEN 800 MG PO TABS: 800.0000 mg | ORAL_TABLET | Freq: Three times a day (TID) | ORAL | Status: DC | PRN

## 2014-07-21 NOTE — Patient Instructions (Addendum)
Check pharmacy for Ibuprofen prescription    Joint Injection Care After Refer to this sheet in the next few days. These instructions provide you with information on caring for yourself after you have had a joint injection. Your caregiver also may give you more specific instructions. Your treatment has been planned according to current medical practices, but problems sometimes occur. Call your caregiver if you have any problems or questions after your procedure. After any type of joint injection, it is not uncommon to experience:  Soreness, swelling, or bruising around the injection site.  Mild numbness, tingling, or weakness around the injection site caused by the numbing medicine used before or with the injection. It also is possible to experience the following effects associated with the specific agent after injection:  Iodine-based contrast agents:  Allergic reaction (itching, hives, widespread redness, and swelling beyond the injection site).  Corticosteroids (These effects are rare.):  Allergic reaction.  Increased blood sugar levels (If you have diabetes and you notice that your blood sugar levels have increased, notify your caregiver).  Increased blood pressure levels.  Mood swings.  Hyaluronic acid in the use of viscosupplementation.  Temporary heat or redness.  Temporary rash and itching.  Increased fluid accumulation in the injected joint. These effects all should resolve within a day after your procedure.  HOME CARE INSTRUCTIONS  Limit yourself to light activity the day of your procedure. Avoid lifting heavy objects, bending, stooping, or twisting.  Take prescription or over-the-counter pain medication as directed by your caregiver.  You may apply ice to your injection site to reduce pain and swelling the day of your procedure. Ice may be applied 03-04 times:  Put ice in a plastic bag.  Place a towel between your skin and the bag.  Leave the ice on for no  longer than 15-20 minutes each time. SEEK IMMEDIATE MEDICAL CARE IF:   Pain and swelling get worse rather than better or extend beyond the injection site.  Numbness does not go away.  Blood or fluid continues to leak from the injection site.  You have chest pain.  You have swelling of your face or tongue.  You have trouble breathing or you become dizzy.  You develop a fever, chills, or severe tenderness at the injection site that last longer than 1 day. MAKE SURE YOU:  Understand these instructions.  Watch your condition.  Get help right away if you are not doing well or if you get worse. Document Released: 03/07/2011 Document Revised: 09/16/2011 Document Reviewed: 03/07/2011 [facility] Patient Information 2015 [place], [place]. This information is not intended to replace advice given to you by your health care provider. Make sure you discuss any questions you have with your health care provider.

## 2014-07-21 NOTE — Progress Notes (Signed)
Patient ID: [name] [name], female   DOB: 11/19/1950, 64 y.o.   MRN: [id number]  Chief Complaint  Patient presents with  . Knee Pain    ER follow up, Left knee pain, hit on table and then fell 1 month ago.... ref [facility]    HPI [name] [name] is a 64 y.o. female.  HPI  Chief Complaint  Patient presents with  . Knee Pain    ER follow up, Left knee pain, hit on table and then fell 1 month ago.... ref [facility]   64 year old female started having problems in her left knee when she hit it against a piece of furniture. She took some ibuprofen that got better but then she felt like the knee was going in and out felt a popping sensation with grinding and went to the ER. X-rays show arthritis of the knee fairly severe of osteopenia. She's had a right total knee in [place] in 2005 which required manipulation, she has lumbar disc disease and was advised to have surgery but declined. She has some hypertension she's having impaired glucose tolerance history along with depression chronic renal insufficiency and other medical problems. She says she doesn't like going to the doctor.  She doesn't like taking medication.  The treatments for her knee include hydrocodone but she doesn't like taking the ibuprofen or the hydrocodone  When we review her results of systems she reports that she has some tingling in her legs which is related to her back problem she has joint pain also related she will she says nothing else bothers her.  Review of Systems Review of Systems   Past Medical History  Diagnosis Date  . Depression   . Glucose intolerance (impaired glucose tolerance)   . Hyperlipidemia   . Hypertension   . Chronic renal insufficiency   . Adenomatous colon polyp 2007  . Diverticulosis 2010  . Tubular adenoma of colon 11/2011    Past Surgical History  Procedure Laterality Date  . Appendectomy    . Replacement total knee  2002  . Ileocolonoscopy  08/22/2008    Rourk- Friable anal canal,  otherwise normal-appearing rectal mucosa, early shallow left-sided diverticula, colonic mucosa, and  terminal ileal mucosa appeared normal.   . Colonoscopy  08/02/2005    Rourk-Internal hemorrhoids, otherwise normal rectum. adenoma  . Colonoscopy  11/14/2011    RMR: Friable anal rectum-likely source of hematochezia Early sigmoid diverticular changes. Colonic tubular adenoma removed     Family History  Problem Relation Age of Onset  . Colon cancer Brother 14  . Lung cancer Brother   . Colon cancer Father 21  . Colon cancer Brother 55    Social History History  Substance Use Topics  . Smoking status: Never Smoker   . Smokeless tobacco: Never Used  . Alcohol Use: No    Allergies  Allergen Reactions  . Ketorolac Tromethamine Anaphylaxis  . Latex Hives    Current Outpatient Prescriptions  Medication Sig Dispense Refill  . ciprofloxacin (CIPRO) 500 MG tablet Take 500 mg by mouth.    Marland Kitchen lisinopril (PRINIVIL,ZESTRIL) 5 MG tablet Take 5 mg by mouth daily.     Marland Kitchen HYDROcodone-acetaminophen (NORCO) 5-325 MG per tablet Take 1-2 tablets by mouth every 4 (four) hours as needed. (Patient not taking: Reported on 07/21/2014) 10 tablet 0  . ibuprofen (ADVIL,MOTRIN) 800 MG tablet Take 1 tablet (800 mg total) by mouth every 8 (eight) hours as needed. 90 tablet 5  . ondansetron (ZOFRAN) 4 MG tablet  Take 1 tablet (4 mg total) by mouth every 6 (six) hours. (Patient not taking: Reported on 07/07/2014) 12 tablet 0   No current facility-administered medications for this visit.       Physical Exam Blood pressure 136/81, height 5\' 11"  (1.803 m), weight 190 lb (86.183 kg). Physical Exam Vitals stable as recorded appearance normal she has a valgus knee on the left. She is oriented 3 mood and affect are flat gait and station show valgus knee with medial thrust.  Upper extremities no signs of rheumatoid arthritis no palpable deformities no palpable tenderness. She does have some crepitance in her  shoulders but normal range of motion stability strength skin pulse and sensation  Right knee has a previous total knee incision and only gets 90 of flexion but it is stable straight leg raise is normal scans intact pulses are good and sensation is normal.  Scars left knee goes she has a small joint effusion her range of motion is also limited to 95 of flexion it is in valgus. She has no collateral ligament instability cruciate ligaments are stable motor exam is normal she has some lateral joint line tenderness skin is intact without rash pulses are good sensation is normal  Cervical spine areas have no lymphadenopathy  She has lumbar spine disease tenderness loss of motion and inability to climb on and off a table or lie down    Data Reviewed Imaging from the hospital shows that she has severe osteopenia. There was some question of for that she had rheumatoid arthritis she says she doesn't. She's not had osteoporosis test.  Assessment    Encounter Diagnoses  Name Primary?  . Primary osteoarthritis of left knee Yes  . DDD (degenerative disc disease), lumbosacral   . H/O total knee replacement, right         Plan    At this point she said have her back fixed. I would not do knee replacement on her left knee until she had it fixed. She would not tolerate rehabilitation in this setting. I did aspirate the knee injected put her on ibuprofen which she says she tolerates fine although she had a reaction to ketorolac   Procedure note injection and aspiration left knee joint  Verbal consent was obtained to aspirate and inject the left knee joint   Timeout was completed to confirm the site of aspiration and injection  An 18-gauge needle was used to aspirate the left knee joint from a suprapatellar lateral approach.  The medications used were 40 mg of Depo-Medrol and 1% lidocaine 3 cc  Anesthesia was provided by ethyl chloride and the skin was prepped with alcohol.  After cleaning  the skin with alcohol an 18-gauge needle was used to aspirate the right knee joint.  We obtained 5 cc of clear fluid  We follow this by injection of 40 mg of Depo-Medrol and 3 cc 1% lidocaine.  There were no complications. A sterile bandage was applied.

## 2014-07-26 ENCOUNTER — Telehealth: Payer: Self-pay | Admitting: Orthopedic Surgery

## 2014-07-26 NOTE — Telephone Encounter (Signed)
Patient called to relay that she is having itching and "looks like a bruise" around injection site, per 07/21/14 office visit.  Please advise.  Her phone # is [phone]

## 2014-07-26 NOTE — Telephone Encounter (Signed)
Routing to Dr Harrison 

## 2014-07-27 NOTE — Telephone Encounter (Signed)
Benadryl 25 mg q 6   Tylenol 500 mg q6   Ice

## 2014-07-27 NOTE — Telephone Encounter (Signed)
Called patient, no answer 

## 2014-07-28 NOTE — Telephone Encounter (Signed)
CALLED PATIENT, NO ANSWER °

## 2015-01-21 ENCOUNTER — Encounter (HOSPITAL_COMMUNITY): Payer: Self-pay | Admitting: Emergency Medicine

## 2015-01-21 ENCOUNTER — Emergency Department (HOSPITAL_COMMUNITY)
Admission: EM | Admit: 2015-01-21 | Discharge: 2015-01-21 | Disposition: A | Discharge status: Home / Self Care | Payer: Medicaid Other | Attending: Emergency Medicine | Admitting: Emergency Medicine

## 2015-01-21 DIAGNOSIS — N189 Chronic kidney disease, unspecified: Secondary | ICD-10-CM | POA: Diagnosis not present

## 2015-01-21 DIAGNOSIS — Z8719 Personal history of other diseases of the digestive system: Secondary | ICD-10-CM | POA: Insufficient documentation

## 2015-01-21 DIAGNOSIS — Z91040 Latex allergy status: Secondary | ICD-10-CM | POA: Insufficient documentation

## 2015-01-21 DIAGNOSIS — Z8639 Personal history of other endocrine, nutritional and metabolic disease: Secondary | ICD-10-CM | POA: Insufficient documentation

## 2015-01-21 DIAGNOSIS — I129 Hypertensive chronic kidney disease with stage 1 through stage 4 chronic kidney disease, or unspecified chronic kidney disease: Secondary | ICD-10-CM | POA: Diagnosis not present

## 2015-01-21 DIAGNOSIS — Z87828 Personal history of other (healed) physical injury and trauma: Secondary | ICD-10-CM | POA: Diagnosis not present

## 2015-01-21 DIAGNOSIS — Z86018 Personal history of other benign neoplasm: Secondary | ICD-10-CM | POA: Diagnosis not present

## 2015-01-21 DIAGNOSIS — Z79899 Other long term (current) drug therapy: Secondary | ICD-10-CM | POA: Insufficient documentation

## 2015-01-21 DIAGNOSIS — Z8659 Personal history of other mental and behavioral disorders: Secondary | ICD-10-CM | POA: Diagnosis not present

## 2015-01-21 DIAGNOSIS — M25511 Pain in right shoulder: Secondary | ICD-10-CM | POA: Diagnosis not present

## 2015-01-21 MED ORDER — DEXAMETHASONE 4 MG PO TABS: 4.0000 mg | ORAL_TABLET | Freq: Two times a day (BID) | ORAL | Status: DC

## 2015-01-21 MED ORDER — HYDROCODONE-ACETAMINOPHEN 5-325 MG PO TABS: 1.0000 | ORAL_TABLET | ORAL | Status: DC | PRN

## 2015-01-21 NOTE — Discharge Instructions (Signed)
Please apply heat to your shoulder when sitting or lying around. Please use Decadron 2 times daily with a meal. Use Tylenol for mild pain, use Norco for more severe pain. Please see Dr. Ninfa Linden, or the orthopedic specialist of your choice for additional evaluation and management of your shoulder pain. Shoulder Pain The shoulder is the joint that connects your arm to your body. Muscles and band-like tissues that connect bones to muscles (tendons) hold the joint together. Shoulder pain is felt if an injury or medical problem affects one or more parts of the shoulder. HOME CARE   Put ice on the sore area.  Put ice in a plastic bag.  Place a towel between your skin and the bag.  Leave the ice on for 15-20 minutes, 03-04 times a day for the first 2 days.  Stop using cold packs if they do not help with the pain.  If you were given something to keep your shoulder from moving (sling; shoulder immobilizer), wear it as told. Only take it off to shower or bathe.  Move your arm as little as possible, but keep your hand moving to prevent puffiness (swelling).  Squeeze a soft ball or foam pad as much as possible to help prevent swelling.  Take medicine as told by your doctor. GET HELP IF:  You have progressing new pain in your arm, hand, or fingers.  Your hand or fingers get cold.  Your medicine does not help lessen your pain. GET HELP RIGHT AWAY IF:   Your arm, hand, or fingers are numb or tingling.  Your arm, hand, or fingers are puffy (swollen), painful, or turn white or blue. MAKE SURE YOU:   Understand these instructions.  Will watch your condition.  Will get help right away if you are not doing well or get worse. Document Released: 12/11/2007 Document Revised: 11/08/2013 Document Reviewed: 01/06/2012 [facility] Patient Information 2015 [place], [place]. This information is not intended to replace advice given to you by your health care provider. Make sure you discuss any questions you  have with your health care provider.

## 2015-01-21 NOTE — ED Notes (Signed)
Pt verbalized understanding of no driving and to use caution within 4 hours of taking pain meds due to meds cause drowsiness 

## 2015-01-21 NOTE — ED Notes (Signed)
Pt c/o continued right shoulder pain from lawn mower accident x 2 months ago.

## 2015-01-21 NOTE — ED Provider Notes (Signed)
CSN: [id number]     Arrival date & time 01/21/15  1231 History   First MD Initiated Contact with Patient 01/21/15 1247     Chief Complaint  Patient presents with  . Shoulder Pain     (Consider location/radiation/quality/duration/timing/severity/associated sxs/prior Treatment) HPI Comments: Patient is a 64 year old female who presents to the emergency department with a complaint of right shoulder pain. The patient states that about 2 months or more ago she sustained an injury to the right shoulder after a lawnmower accident. The patient states since that time she's been having more and more problems with her right shoulder. She was evaluated by orthopedics, and told that she possibly had some rotator cuff injury as well as some other injury. Surgery was encouraged at that time, however the patient states she did not want to have surgery on her shoulder. She has to do a lot of work with her shoulders, and she states that it seems as though this problem is getting worse and worse. His been no high fever. The patient does note some tingling down her arm from time to time. She is not dropping objects, but states that she cannot lift her arm up overhead without severe problem. She also notices a crunch or pop in her shoulder with certain movements.  The history is provided by the patient.    Past Medical History  Diagnosis Date  . Depression   . Glucose intolerance (impaired glucose tolerance)   . Hyperlipidemia   . Hypertension   . Chronic renal insufficiency   . Adenomatous colon polyp 2007  . Diverticulosis 2010  . Tubular adenoma of colon 11/2011   Past Surgical History  Procedure Laterality Date  . Appendectomy    . Replacement total knee  2002  . Ileocolonoscopy  08/22/2008    Rourk- Friable anal canal, otherwise normal-appearing rectal mucosa, early shallow left-sided diverticula, colonic mucosa, and  terminal ileal mucosa appeared normal.   . Colonoscopy  08/02/2005     Rourk-Internal hemorrhoids, otherwise normal rectum. adenoma  . Colonoscopy  11/14/2011    RMR: Friable anal rectum-likely source of hematochezia Early sigmoid diverticular changes. Colonic tubular adenoma removed    Family History  Problem Relation Age of Onset  . Colon cancer Brother 36  . Lung cancer Brother   . Colon cancer Father 28  . Colon cancer Brother 14   History  Substance Use Topics  . Smoking status: Never Smoker   . Smokeless tobacco: Never Used  . Alcohol Use: No   OB History    Gravida Para Term Preterm AB TAB SAB Ectopic Multiple Living   3 2 1 1 1  1   2      Review of Systems  Musculoskeletal: Positive for arthralgias.  Psychiatric/Behavioral:       Depression  All other systems reviewed and are negative.     Allergies  Ketorolac tromethamine and Latex  Home Medications   Prior to Admission medications   Medication Sig Start Date End Date Taking? Authorizing Provider  ciprofloxacin (CIPRO) 500 MG tablet Take 500 mg by mouth.    Historical Provider, MD  HYDROcodone-acetaminophen (NORCO) 5-325 MG per tablet Take 1-2 tablets by mouth every 4 (four) hours as needed. Patient not taking: Reported on 07/21/2014 07/07/14   Elnora Morrison, MD  ibuprofen (ADVIL,MOTRIN) 800 MG tablet Take 1 tablet (800 mg total) by mouth every 8 (eight) hours as needed. 07/21/14   Carole Civil, MD  lisinopril (PRINIVIL,ZESTRIL) 5 MG tablet Take 5  mg by mouth daily.     Historical Provider, MD  ondansetron (ZOFRAN) 4 MG tablet Take 1 tablet (4 mg total) by mouth every 6 (six) hours. Patient not taking: Reported on 07/07/2014 07/13/12   Virgel Manifold, MD   BP 137/91 mmHg  Pulse 77  Temp(Src) 98.2 F (36.8 C) (Oral)  Resp 20  Wt 207 lb (93.895 kg)  SpO2 97% Physical Exam  Constitutional: She is oriented to person, place, and time. She appears well-developed and well-nourished.  Non-toxic appearance.  HENT:  Head: Normocephalic.  Right Ear: Tympanic membrane and external  ear normal.  Left Ear: Tympanic membrane and external ear normal.  Eyes: EOM and lids are normal. Pupils are equal, round, and reactive to light.  Neck: Normal range of motion. Neck supple. Carotid bruit is not present.  Cardiovascular: Normal rate, regular rhythm, normal heart sounds, intact distal pulses and normal pulses.   Pulmonary/Chest: Breath sounds normal. No respiratory distress.  Abdominal: Soft. Bowel sounds are normal. There is no tenderness. There is no guarding.  Musculoskeletal:       Right shoulder: She exhibits decreased range of motion, tenderness and crepitus. She exhibits no swelling, no effusion and normal pulse.  Lymphadenopathy:       Head (right side): No submandibular adenopathy present.       Head (left side): No submandibular adenopathy present.    She has no cervical adenopathy.  Neurological: She is alert and oriented to person, place, and time. She has normal strength. No cranial nerve deficit or sensory deficit.  Skin: Skin is warm and dry.  Psychiatric: She has a normal mood and affect. Her speech is normal.  Nursing note and vitals reviewed.   ED Course  Procedures (including critical care time) Labs Review Labs Reviewed - No data to display  Imaging Review No results found.   EKG Interpretation None      MDM  Vital signs within normal limits. There is pain and crepitus with attempted range of motion of the right shoulder. There is no dislocation present. There is no evidence of hot joint.  Discuss with patient the need for with a peak follow-up. Patient will be treated with Decadron and Norco for now. Discussed with the patient that management of this issue should be done through orthopedics. Patient knowledge is understanding of this discharge instruction.    Final diagnoses:  None    **I have reviewed nursing notes, vital signs, and all appropriate lab and imaging results for this patient.Lily Kocher, PA-C 01/21/15  [place], MD 01/21/15 1406

## 2015-05-12 ENCOUNTER — Encounter: Payer: Self-pay | Admitting: Gastroenterology

## 2015-05-12 ENCOUNTER — Other Ambulatory Visit (HOSPITAL_COMMUNITY)
Admission: RE | Admit: 2015-05-12 | Discharge: 2015-05-12 | Disposition: A | Discharge status: Home / Self Care | Payer: Medicaid Other | Source: Ambulatory Visit | Attending: Gastroenterology | Admitting: Gastroenterology

## 2015-05-12 ENCOUNTER — Other Ambulatory Visit: Payer: Self-pay

## 2015-05-12 ENCOUNTER — Ambulatory Visit (INDEPENDENT_AMBULATORY_CARE_PROVIDER_SITE_OTHER): Payer: Medicaid Other | Admitting: Gastroenterology

## 2015-05-12 VITALS — BP 138/76 | HR 54 | Temp 98.3°F | Ht 67.0 in | Wt 199.8 lb

## 2015-05-12 DIAGNOSIS — K623 Rectal prolapse: Secondary | ICD-10-CM | POA: Insufficient documentation

## 2015-05-12 DIAGNOSIS — Z800 Family history of malignant neoplasm of digestive organs: Secondary | ICD-10-CM | POA: Diagnosis not present

## 2015-05-12 DIAGNOSIS — K625 Hemorrhage of anus and rectum: Secondary | ICD-10-CM

## 2015-05-12 DIAGNOSIS — Z86010 Personal history of colonic polyps: Secondary | ICD-10-CM | POA: Diagnosis not present

## 2015-05-12 LAB — CBC
HCT: 38 % (ref 36.0–46.0)
Hemoglobin: 12.5 g/dL (ref 12.0–15.0)
MCH: 30.8 pg (ref 26.0–34.0)
MCHC: 32.9 g/dL (ref 30.0–36.0)
MCV: 93.6 fL (ref 78.0–100.0)
PLATELETS: 194 10*3/uL (ref 150–400)
RBC: 4.06 MIL/uL (ref 3.87–5.11)
RDW: 13.9 % (ref 11.5–15.5)
WBC: 4.7 10*3/uL (ref 4.0–10.5)

## 2015-05-12 MED ORDER — PEG 3350-KCL-NA BICARB-NACL 420 G PO SOLR: 4000.0000 mL | Freq: Once | ORAL | Status: DC

## 2015-05-12 NOTE — Progress Notes (Signed)
Primary Care Physician:  Chiquita Loth, MD Primary Gastroenterologist:  Dr. Gala Romney    Chief Complaint  Patient presents with  . Rectal Bleeding    HPI:   [name] [name] is a 64 y.o. female presenting today as a self-referral. She was last seen in Jan 2014 by our practice.   Last week had  4 days of moderately heavy rectal bleeding. No rectal discomfort but noted abdominal bloating at the time. Occasional constipation. BM usually every day. No abdominal pain. No weight loss or lack of appetite. Was taking Ibuprofen for leg pain. No upper GI symptoms.   Last colonoscopy May 2013 with tubular adenoma, early diverticular changes and friable anorectum. She had 3 first-degree relatives with colon cancer to include 2 brothers and her father. She is concerned about the possibility of malignancy.   Past Medical History  Diagnosis Date  . Depression   . Glucose intolerance (impaired glucose tolerance)   . Hyperlipidemia   . Hypertension   . Chronic renal insufficiency   . Adenomatous colon polyp 2007  . Diverticulosis 2010  . Tubular adenoma of colon 11/2011    Past Surgical History  Procedure Laterality Date  . Appendectomy    . Replacement total knee  2002  . Ileocolonoscopy  08/22/2008    Rourk- Friable anal canal, otherwise normal-appearing rectal mucosa, early shallow left-sided diverticula, colonic mucosa, and  terminal ileal mucosa appeared normal.   . Colonoscopy  08/02/2005    Rourk-Internal hemorrhoids, otherwise normal rectum. adenoma  . Colonoscopy  11/14/2011    RMR: Friable anal rectum-likely source of hematochezia Early sigmoid diverticular changes. Colonic tubular adenoma removed     Current Outpatient Prescriptions  Medication Sig Dispense Refill  . ibuprofen (ADVIL,MOTRIN) 800 MG tablet Take 1 tablet (800 mg total) by mouth every 8 (eight) hours as needed. 90 tablet 5  . lisinopril (PRINIVIL,ZESTRIL) 5 MG tablet Take 5 mg by mouth daily.      No current  facility-administered medications for this visit.    Allergies as of 05/12/2015 - Review Complete 05/12/2015  Allergen Reaction Noted  . Ketorolac tromethamine Anaphylaxis   . Latex Hives     Family History  Problem Relation Age of Onset  . Colon cancer Brother 69  . Lung cancer Brother   . Colon cancer Father 17  . Colon cancer Brother 21    Social History   Social History  . Marital Status: Legally Separated    Spouse Name: N/A  . Number of Children: N/A  . Years of Education: N/A   Occupational History  . disabled, [place] History Main Topics  . Smoking status: Never Smoker   . Smokeless tobacco: Never Used  . Alcohol Use: No  . Drug Use: No  . Sexual Activity:    Partners: Male    Birth Control/ Protection: None     Comment: boyfriend   Other Topics Concern  . Not on file   Social History Narrative    Review of Systems: Gen: Denies any fever, chills, fatigue, weight loss, lack of appetite.  CV: Denies chest pain, heart palpitations, peripheral edema, syncope.  Resp: Denies shortness of breath at rest or with exertion. Denies wheezing or cough.  GI: see HPI  GU : Denies urinary burning, urinary frequency, urinary hesitancy MS: Denies joint pain, muscle weakness, cramps, or limitation of movement.  Derm: Denies rash, itching, dry skin Psych: Denies depression, anxiety, memory loss, and confusion Heme: Denies  bruising, bleeding, and enlarged lymph nodes.  Physical Exam: BP 138/76 mmHg  Pulse 54  Temp(Src) 98.3 F (36.8 C) (Oral)  Ht 5\' 7"  (1.702 m)  Wt 199 lb 12.8 oz (90.629 kg)  BMI 31.29 kg/m2 General:   Alert and oriented. Pleasant and cooperative. Well-nourished and well-developed.  Head:  Normocephalic and atraumatic. Eyes:  Without icterus, sclera clear and conjunctiva pink.  Ears:  Normal auditory acuity. Nose:  No deformity, discharge,  or lesions. Mouth:  No deformity or lesions, oral mucosa pink.  Lungs:  Clear to  auscultation bilaterally. No wheezes, rales, or rhonchi. No distress.  Heart:  S1, S2 present without murmurs appreciated.  Abdomen:  +BS, soft, non-tender and non-distended. No HSM noted. No guarding or rebound. No masses appreciated.  Rectal:  Deferred  Msk:  Symmetrical without gross deformities. Normal posture. Extremities:  Without edema. Neurologic:  Alert and  oriented x4;  grossly normal neurologically. Skin:  Intact without significant lesions or rashes. Psych:  Alert and cooperative. Normal mood and affect.

## 2015-05-12 NOTE — Patient Instructions (Signed)
We have scheduled you for a colonoscopy with Dr. Gala Romney.  Please have blood work done today as well. I am just checking your blood count to make sure there is no anemia.

## 2015-05-15 NOTE — Assessment & Plan Note (Signed)
Early interval colonoscopy now due to rectal bleeding.

## 2015-05-15 NOTE — Progress Notes (Signed)
CC'D TO PCP °

## 2015-05-15 NOTE — Progress Notes (Signed)
Quick Note:  CBC is normal. No evidence of anemia. Proceed with colonoscopy as planned. ______

## 2015-05-15 NOTE — Assessment & Plan Note (Signed)
Early diagnosis in brothers, father with colon cancer as well. Early interval colonoscopy now due to high risk.

## 2015-05-15 NOTE — Assessment & Plan Note (Addendum)
64 year old female with recent 4 day history of moderate volume rectal bleeding, now resolved at time of visit, with last colonoscopy in May 2013 with tubular adenoma, early diverticular changes and friable anorectum. Likely benign source; however, she has 3 first-degree relatives with a history of colon cancer to include father and two brothers. She has taking NSAIDs chronically; unable to exclude NSAID-injury. Needs lower GI evaluation due to new onset rectal bleeding and significant family history.   Proceed with TCS with Dr. Gala Romney in near future: the risks, benefits, and alternatives have been discussed with the patient in detail. The patient states understanding and desires to proceed. Check CBC now

## 2015-05-16 ENCOUNTER — Telehealth: Payer: Self-pay

## 2015-05-16 NOTE — Telephone Encounter (Signed)
Opened in error

## 2015-05-17 ENCOUNTER — Ambulatory Visit (HOSPITAL_COMMUNITY)
Admission: RE | Admit: 2015-05-17 | Discharge: 2015-05-17 | Disposition: A | Discharge status: Home / Self Care | Payer: Medicaid Other | Source: Ambulatory Visit | Attending: Internal Medicine | Admitting: Internal Medicine

## 2015-05-17 ENCOUNTER — Encounter (HOSPITAL_COMMUNITY)
Admission: RE | Disposition: A | Discharge status: Home / Self Care | Payer: Self-pay | Source: Ambulatory Visit | Attending: Internal Medicine

## 2015-05-17 ENCOUNTER — Encounter (HOSPITAL_COMMUNITY): Payer: Self-pay | Admitting: *Deleted

## 2015-05-17 DIAGNOSIS — E785 Hyperlipidemia, unspecified: Secondary | ICD-10-CM | POA: Diagnosis not present

## 2015-05-17 DIAGNOSIS — Z79899 Other long term (current) drug therapy: Secondary | ICD-10-CM | POA: Diagnosis not present

## 2015-05-17 DIAGNOSIS — F329 Major depressive disorder, single episode, unspecified: Secondary | ICD-10-CM | POA: Diagnosis not present

## 2015-05-17 DIAGNOSIS — K5900 Constipation, unspecified: Secondary | ICD-10-CM | POA: Diagnosis not present

## 2015-05-17 DIAGNOSIS — K648 Other hemorrhoids: Secondary | ICD-10-CM | POA: Diagnosis not present

## 2015-05-17 DIAGNOSIS — K921 Melena: Secondary | ICD-10-CM | POA: Insufficient documentation

## 2015-05-17 DIAGNOSIS — Z86010 Personal history of colonic polyps: Secondary | ICD-10-CM | POA: Diagnosis not present

## 2015-05-17 DIAGNOSIS — Z800 Family history of malignant neoplasm of digestive organs: Secondary | ICD-10-CM | POA: Insufficient documentation

## 2015-05-17 DIAGNOSIS — I129 Hypertensive chronic kidney disease with stage 1 through stage 4 chronic kidney disease, or unspecified chronic kidney disease: Secondary | ICD-10-CM | POA: Insufficient documentation

## 2015-05-17 DIAGNOSIS — K625 Hemorrhage of anus and rectum: Secondary | ICD-10-CM | POA: Diagnosis not present

## 2015-05-17 DIAGNOSIS — K5730 Diverticulosis of large intestine without perforation or abscess without bleeding: Secondary | ICD-10-CM | POA: Insufficient documentation

## 2015-05-17 HISTORY — PX: COLONOSCOPY: SHX5424

## 2015-05-17 SURGERY — COLONOSCOPY
Anesthesia: Moderate Sedation

## 2015-05-17 MED ORDER — SODIUM CHLORIDE 0.9 % IV SOLN
INTRAVENOUS | Status: DC
  Administered 2015-05-17: 1000 mL via INTRAVENOUS

## 2015-05-17 MED ORDER — ONDANSETRON HCL 4 MG/2ML IJ SOLN
INTRAMUSCULAR | Status: AC
  Filled 2015-05-17: qty 2

## 2015-05-17 MED ORDER — MIDAZOLAM HCL 5 MG/5ML IJ SOLN
INTRAMUSCULAR | Status: AC
  Filled 2015-05-17: qty 10

## 2015-05-17 MED ORDER — MIDAZOLAM HCL 5 MG/5ML IJ SOLN
INTRAMUSCULAR | Status: DC | PRN
Start: 2015-05-17 — End: 2015-05-17
  Administered 2015-05-17: 2 mg via INTRAVENOUS
  Administered 2015-05-17 (×2): 1 mg via INTRAVENOUS

## 2015-05-17 MED ORDER — MEPERIDINE HCL 100 MG/ML IJ SOLN
INTRAMUSCULAR | Status: DC | PRN
  Administered 2015-05-17: 50 mg via INTRAVENOUS

## 2015-05-17 MED ORDER — MEPERIDINE HCL 100 MG/ML IJ SOLN
INTRAMUSCULAR | Status: AC
  Filled 2015-05-17: qty 2

## 2015-05-17 MED ORDER — ONDANSETRON HCL 4 MG/2ML IJ SOLN
INTRAMUSCULAR | Status: DC | PRN
  Administered 2015-05-17: 4 mg via INTRAVENOUS

## 2015-05-17 NOTE — Discharge Instructions (Signed)
Colonoscopy Discharge Instructions  Read the instructions outlined below and refer to this sheet in the next few weeks. These discharge instructions provide you with general information on caring for yourself after you leave the hospital. Your doctor may also give you specific instructions. While your treatment has been planned according to the most current medical practices available, unavoidable complications occasionally occur. If you have any problems or questions after discharge, call Dr. Gala Romney at [phone]. ACTIVITY  You may resume your regular activity, but move at a slower pace for the next 24 hours.   Take frequent rest periods for the next 24 hours.   Walking will help get rid of the air and reduce the bloated feeling in your belly (abdomen).   No driving for 24 hours (because of the medicine (anesthesia) used during the test).    Do not sign any important legal documents or operate any machinery for 24 hours (because of the anesthesia used during the test).  NUTRITION  Drink plenty of fluids.   You may resume your normal diet as instructed by your doctor.   Begin with a light meal and progress to your normal diet. Heavy or fried foods are harder to digest and may make you feel sick to your stomach (nauseated).   Avoid alcoholic beverages for 24 hours or as instructed.  MEDICATIONS  You may resume your normal medications unless your doctor tells you otherwise.  WHAT YOU CAN EXPECT TODAY  Some feelings of bloating in the abdomen.   Passage of more gas than usual.   Spotting of blood in your stool or on the toilet paper.  IF YOU HAD POLYPS REMOVED DURING THE COLONOSCOPY:  No aspirin products for 7 days or as instructed.   No alcohol for 7 days or as instructed.   Eat a soft diet for the next 24 hours.  FINDING OUT THE RESULTS OF YOUR TEST Not all test results are available during your visit. If your test results are not back during the visit, make an appointment  with your caregiver to find out the results. Do not assume everything is normal if you have not heard from your caregiver or the medical facility. It is important for you to follow up on all of your test results.  SEEK IMMEDIATE MEDICAL ATTENTION IF:  You have more than a spotting of blood in your stool.   Your belly is swollen (abdominal distention).   You are nauseated or vomiting.   You have a temperature over 101.   You have abdominal pain or discomfort that is severe or gets worse throughout the day.     Hemorrhoid, constipation and diverticulosis information provided  Repeat colonoscopy for screening purposes in 5 years  Course of Anusol suppositories as directed  Begin Benefiber 2 teaspoons twice daily  Take MiraLAX 17 g orally nightly on any given day without a bowel movement  Office visit with [place] in 4-6 weeks. Hemorrhoids may benefit from banding in the office.Hemorrhoids Hemorrhoids are swollen veins around the rectum or anus. There are two types of hemorrhoids:   Internal hemorrhoids. These occur in the veins just inside the rectum. They may poke through to the outside and become irritated and painful.  External hemorrhoids. These occur in the veins outside the anus and can be felt as a painful swelling or hard lump near the anus. CAUSES  Pregnancy.   Obesity.   Constipation or diarrhea.   Straining to have a bowel movement.   Sitting for [place]  periods on the toilet.  Heavy lifting or other activity that caused you to strain.  Anal intercourse. SYMPTOMS   Pain.   Anal itching or irritation.   Rectal bleeding.   Fecal leakage.   Anal swelling.   One or more lumps around the anus.  DIAGNOSIS  Your caregiver may be able to diagnose hemorrhoids by visual examination. Other examinations or tests that may be performed include:   Examination of the rectal area with a gloved hand (digital rectal exam).   Examination of anal canal using a  small tube (scope).   A blood test if you have lost a significant amount of blood.  A test to look inside the colon (sigmoidoscopy or colonoscopy). TREATMENT Most hemorrhoids can be treated at home. However, if symptoms do not seem to be getting better or if you have a lot of rectal bleeding, your caregiver may perform a procedure to help make the hemorrhoids get smaller or remove them completely. Possible treatments include:   Placing a rubber band at the base of the hemorrhoid to cut off the circulation (rubber band ligation).   Injecting a chemical to shrink the hemorrhoid (sclerotherapy).   Using a tool to burn the hemorrhoid (infrared light therapy).   Surgically removing the hemorrhoid (hemorrhoidectomy).   Stapling the hemorrhoid to block blood flow to the tissue (hemorrhoid stapling).  HOME CARE INSTRUCTIONS   Eat foods with fiber, such as whole grains, beans, nuts, fruits, and vegetables. Ask your doctor about taking products with added fiber in them (fibersupplements).  Increase fluid intake. Drink enough water and fluids to keep your urine clear or pale yellow.   Exercise regularly.   Go to the bathroom when you have the urge to have a bowel movement. Do not wait.   Avoid straining to have bowel movements.   Keep the anal area dry and clean. Use wet toilet paper or moist towelettes after a bowel movement.   Medicated creams and suppositories may be used or applied as directed.   Only take over-the-counter or prescription medicines as directed by your caregiver.   Take warm sitz baths for 15-20 minutes, 3-4 times a day to ease pain and discomfort.   Place ice packs on the hemorrhoids if they are tender and swollen. Using ice packs between sitz baths may be helpful.   Put ice in a plastic bag.   Place a towel between your skin and the bag.   Leave the ice on for 15-20 minutes, 3-4 times a day.   Do not use a donut-shaped pillow or sit on the  toilet for [place] periods. This increases blood pooling and pain.  SEEK MEDICAL CARE IF:  You have increasing pain and swelling that is not controlled by treatment or medicine.  You have uncontrolled bleeding.  You have difficulty or you are unable to have a bowel movement.  You have pain or inflammation outside the area of the hemorrhoids. MAKE SURE YOU:  Understand these instructions.  Will watch your condition.  Will get help right away if you are not doing well or get worse.   This information is not intended to replace advice given to you by your health care provider. Make sure you discuss any questions you have with your health care provider.   Document Released: 06/21/2000 Document Revised: 06/10/2012 Document Reviewed: 04/28/2012 Elsevier Interactive Patient Education [employer].

## 2015-05-17 NOTE — H&P (View-Only) (Signed)
Primary Care Physician:  Chiquita Loth, MD Primary Gastroenterologist:  Dr. Gala Romney    Chief Complaint  Patient presents with  . Rectal Bleeding    HPI:   [name] [name] is a 64 y.o. female presenting today as a self-referral. She was last seen in Jan 2014 by our practice.   Last week had  4 days of moderately heavy rectal bleeding. No rectal discomfort but noted abdominal bloating at the time. Occasional constipation. BM usually every day. No abdominal pain. No weight loss or lack of appetite. Was taking Ibuprofen for leg pain. No upper GI symptoms.   Last colonoscopy May 2013 with tubular adenoma, early diverticular changes and friable anorectum. She had 3 first-degree relatives with colon cancer to include 2 brothers and her father. She is concerned about the possibility of malignancy.   Past Medical History  Diagnosis Date  . Depression   . Glucose intolerance (impaired glucose tolerance)   . Hyperlipidemia   . Hypertension   . Chronic renal insufficiency   . Adenomatous colon polyp 2007  . Diverticulosis 2010  . Tubular adenoma of colon 11/2011    Past Surgical History  Procedure Laterality Date  . Appendectomy    . Replacement total knee  2002  . Ileocolonoscopy  08/22/2008    Rourk- Friable anal canal, otherwise normal-appearing rectal mucosa, early shallow left-sided diverticula, colonic mucosa, and  terminal ileal mucosa appeared normal.   . Colonoscopy  08/02/2005    Rourk-Internal hemorrhoids, otherwise normal rectum. adenoma  . Colonoscopy  11/14/2011    RMR: Friable anal rectum-likely source of hematochezia Early sigmoid diverticular changes. Colonic tubular adenoma removed     Current Outpatient Prescriptions  Medication Sig Dispense Refill  . ibuprofen (ADVIL,MOTRIN) 800 MG tablet Take 1 tablet (800 mg total) by mouth every 8 (eight) hours as needed. 90 tablet 5  . lisinopril (PRINIVIL,ZESTRIL) 5 MG tablet Take 5 mg by mouth daily.      No current  facility-administered medications for this visit.    Allergies as of 05/12/2015 - Review Complete 05/12/2015  Allergen Reaction Noted  . Ketorolac tromethamine Anaphylaxis   . Latex Hives     Family History  Problem Relation Age of Onset  . Colon cancer Brother 61  . Lung cancer Brother   . Colon cancer Father 5  . Colon cancer Brother 25    Social History   Social History  . Marital Status: Legally Separated    Spouse Name: N/A  . Number of Children: N/A  . Years of Education: N/A   Occupational History  . disabled, [employer] History Main Topics  . Smoking status: Never Smoker   . Smokeless tobacco: Never Used  . Alcohol Use: No  . Drug Use: No  . Sexual Activity:    Partners: Male    Birth Control/ Protection: None     Comment: boyfriend   Other Topics Concern  . Not on file   Social History Narrative    Review of Systems: Gen: Denies any fever, chills, fatigue, weight loss, lack of appetite.  CV: Denies chest pain, heart palpitations, peripheral edema, syncope.  Resp: Denies shortness of breath at rest or with exertion. Denies wheezing or cough.  GI: see HPI  GU : Denies urinary burning, urinary frequency, urinary hesitancy MS: Denies joint pain, muscle weakness, cramps, or limitation of movement.  Derm: Denies rash, itching, dry skin Psych: Denies depression, anxiety, memory loss, and confusion Heme: Denies  bruising, bleeding, and enlarged lymph nodes.  Physical Exam: BP 138/76 mmHg  Pulse 54  Temp(Src) 98.3 F (36.8 C) (Oral)  Ht 5\' 7"  (1.702 m)  Wt 199 lb 12.8 oz (90.629 kg)  BMI 31.29 kg/m2 General:   Alert and oriented. Pleasant and cooperative. Well-nourished and well-developed.  Head:  Normocephalic and atraumatic. Eyes:  Without icterus, sclera clear and conjunctiva pink.  Ears:  Normal auditory acuity. Nose:  No deformity, discharge,  or lesions. Mouth:  No deformity or lesions, oral mucosa pink.  Lungs:  Clear to  auscultation bilaterally. No wheezes, rales, or rhonchi. No distress.  Heart:  S1, S2 present without murmurs appreciated.  Abdomen:  +BS, soft, non-tender and non-distended. No HSM noted. No guarding or rebound. No masses appreciated.  Rectal:  Deferred  Msk:  Symmetrical without gross deformities. Normal posture. Extremities:  Without edema. Neurologic:  Alert and  oriented x4;  grossly normal neurologically. Skin:  Intact without significant lesions or rashes. Psych:  Alert and cooperative. Normal mood and affect.

## 2015-05-17 NOTE — Op Note (Signed)
[facility] [address] [place], [address]   COLONOSCOPY PROCEDURE REPORT  PATIENT: [name] [name], [name] [name]  MR#: [id number] BIRTHDATE: 10-28-1950 , 88  yrs. old GENDER: female ENDOSCOPIST: R.  Garfield Cornea, MD FACP [facility] REFERRED LK:[id number] Satira Anis, M.D. PROCEDURE DATE:  21-May-2015 PROCEDURE:   Colonoscopy, diagnostic INDICATIONS:Paper hematochezia; constipation; positive family history of colon cancer. MEDICATIONS: Versed 4 mg IV and Demerol 50 mg IV in divided doses. Zofran 4 mg IV. ASA CLASS:       Class II  CONSENT: The risks, benefits, alternatives and imponderables including but not limited to bleeding, perforation as well as the possibility of a missed lesion have been reviewed.  The potential for biopsy, lesion removal, etc. have also been discussed. Questions have been answered.  All parties agreeable.  Please see the history and physical in the medical record for more information.  DESCRIPTION OF PROCEDURE:   After the risks benefits and alternatives of the procedure were thoroughly explained, informed consent was obtained.  The digital rectal exam revealed no abnormalities of the rectum.   The EC-[id number] ([id number])  endoscope was introduced through the anus and advanced to the terminal ileum which was intubated for a short distance.      The quality of the prep was adequate  The instrument was then slowly withdrawn as the colon was fully examined. Estimated blood loss is zero unless otherwise noted in this procedure report.      COLON FINDINGS: Internal hemorrhoids; otherwise, normal-appearing rectal mucosa.  Scattered left-sided diverticula; the remainder of the colonic mucosa appeared normal.  The distal 5 cm of terminal ileal mucosa also appeared normal.  Retroflexion was performed. .  Withdrawal time=12 minutes 0 seconds.  The scope was withdrawn and the procedure completed. COMPLICATIONS: There were no immediate complications.  ENDOSCOPIC  IMPRESSION: Internal hemorrhoids?"likely source of hematochezia in the setting of significant constipation. Colonic diverticulosis  RECOMMENDATIONS: Course of Anusol suppositories. Begin Benefiber 2 teaspoons twice daily. MiraLAX 17 g orally nightly on any given day without a bowel movement. Office visit in 4-6 weeks. If rectal bleeding persists, may be a reasonably good hemorrhoid banding candidate.  eSigned:  R. Garfield Cornea, MD Rosalita Chessman [facility] 05/21/2015 10:03 AM   cc:  CPT CODES: ICD CODES:  The ICD and CPT codes recommended by this software are interpretations from the data that the clinical staff has captured with the software.  The verification of the translation of this report to the ICD and CPT codes and modifiers is the sole responsibility of the health care institution and practicing physician where this report was generated.  [employer] [place]. will not be held responsible for the validity of the ICD and CPT codes included on this report.  AMA assumes no liability for data contained or not contained herein. CPT is a Designer, television/film set of the [employer].  PATIENT NAME:  [name] [name], [name] [name] MR#: [id number]

## 2015-05-17 NOTE — Interval H&P Note (Signed)
History and Physical Interval Note:  05/17/2015 9:34 AM  Linnie S Vowels  has presented today for surgery, with the diagnosis of rectal bleeding, family history of colon cancer  The various methods of treatment have been discussed with the patient and family. After consideration of risks, benefits and other options for treatment, the patient has consented to  Procedure(s) with comments: COLONOSCOPY (N/A) - 10:15 as a surgical intervention .  The patient's history has been reviewed, patient examined, no change in status, stable for surgery.  I have reviewed the patient's chart and labs.  Questions were answered to the patient's satisfaction.     Khali Perella  No change. Patient states she is frequently constipated, going upwards of one week without a bowel movement. Regular straining. On no treatment. Diagnostic colonoscopy today per plan. The risks, benefits, limitations, alternatives and imponderables have been reviewed with the patient. Questions have been answered. All parties are agreeable.

## 2015-05-23 ENCOUNTER — Encounter (HOSPITAL_COMMUNITY): Payer: Self-pay | Admitting: Internal Medicine

## 2015-06-06 ENCOUNTER — Telehealth: Payer: Self-pay | Admitting: Internal Medicine

## 2015-06-06 ENCOUNTER — Encounter: Payer: Self-pay | Admitting: Internal Medicine

## 2015-06-06 NOTE — Telephone Encounter (Signed)
Daisey from [facility] called to see when patient was due her next colonoscopy. She is on recall for 2018 for 5 yr colonoscopy, but had procedure done earlier this month.  Please advise when next colonoscopy is due. Daisey asked for me to call her back at [phone]

## 2015-06-06 NOTE — Telephone Encounter (Signed)
Reminder in epic, [place] made and letter mailed. I tried to call [place] at [facility] and was told I would have to call back.

## 2015-06-06 NOTE — Telephone Encounter (Signed)
Per RMR AVS- pt needs screening tcs in 5 years and a follow up appointment with [place] in 4-6 weeks.

## 2015-06-17 ENCOUNTER — Encounter (HOSPITAL_COMMUNITY): Payer: Self-pay | Admitting: Emergency Medicine

## 2015-06-17 ENCOUNTER — Emergency Department (HOSPITAL_COMMUNITY)
Admission: EM | Admit: 2015-06-17 | Discharge: 2015-06-17 | Disposition: A | Discharge status: Home / Self Care | Payer: Medicaid Other | Attending: Emergency Medicine | Admitting: Emergency Medicine

## 2015-06-17 DIAGNOSIS — Z79899 Other long term (current) drug therapy: Secondary | ICD-10-CM | POA: Insufficient documentation

## 2015-06-17 DIAGNOSIS — N189 Chronic kidney disease, unspecified: Secondary | ICD-10-CM | POA: Diagnosis not present

## 2015-06-17 DIAGNOSIS — Z86018 Personal history of other benign neoplasm: Secondary | ICD-10-CM | POA: Diagnosis not present

## 2015-06-17 DIAGNOSIS — I129 Hypertensive chronic kidney disease with stage 1 through stage 4 chronic kidney disease, or unspecified chronic kidney disease: Secondary | ICD-10-CM | POA: Insufficient documentation

## 2015-06-17 DIAGNOSIS — Z8659 Personal history of other mental and behavioral disorders: Secondary | ICD-10-CM | POA: Diagnosis not present

## 2015-06-17 DIAGNOSIS — Z8639 Personal history of other endocrine, nutritional and metabolic disease: Secondary | ICD-10-CM | POA: Insufficient documentation

## 2015-06-17 DIAGNOSIS — Z86010 Personal history of colonic polyps: Secondary | ICD-10-CM | POA: Insufficient documentation

## 2015-06-17 DIAGNOSIS — Z8719 Personal history of other diseases of the digestive system: Secondary | ICD-10-CM | POA: Diagnosis not present

## 2015-06-17 DIAGNOSIS — R300 Dysuria: Secondary | ICD-10-CM | POA: Diagnosis present

## 2015-06-17 DIAGNOSIS — N390 Urinary tract infection, site not specified: Secondary | ICD-10-CM | POA: Diagnosis not present

## 2015-06-17 DIAGNOSIS — Z91040 Latex allergy status: Secondary | ICD-10-CM | POA: Insufficient documentation

## 2015-06-17 LAB — URINALYSIS, ROUTINE W REFLEX MICROSCOPIC
Bilirubin Urine: NEGATIVE
GLUCOSE, UA: NEGATIVE mg/dL
HGB URINE DIPSTICK: NEGATIVE
KETONES UR: NEGATIVE mg/dL
Nitrite: NEGATIVE
PROTEIN: NEGATIVE mg/dL
Specific Gravity, Urine: 1.025 (ref 1.005–1.030)
pH: 6 (ref 5.0–8.0)

## 2015-06-17 LAB — URINE MICROSCOPIC-ADD ON

## 2015-06-17 MED ORDER — SULFAMETHOXAZOLE-TRIMETHOPRIM 800-160 MG PO TABS
1.0000 | ORAL_TABLET | Freq: Once | ORAL | Status: AC
  Administered 2015-06-17: 1 via ORAL
  Filled 2015-06-17: qty 1

## 2015-06-17 MED ORDER — PHENAZOPYRIDINE HCL 200 MG PO TABS: 200.0000 mg | ORAL_TABLET | Freq: Three times a day (TID) | ORAL | Status: DC

## 2015-06-17 MED ORDER — PHENAZOPYRIDINE HCL 100 MG PO TABS
200.0000 mg | ORAL_TABLET | Freq: Once | ORAL | Status: AC
  Administered 2015-06-17: 200 mg via ORAL
  Filled 2015-06-17: qty 2

## 2015-06-17 MED ORDER — SULFAMETHOXAZOLE-TRIMETHOPRIM 800-160 MG PO TABS: 1.0000 | ORAL_TABLET | Freq: Two times a day (BID) | ORAL | Status: AC

## 2015-06-17 NOTE — ED Notes (Signed)
Discharge instructions given to pt - Encouraged pt to increase po fluid intake at work and if need be to take toilet paper with her from home . Pt laughed and stated that she was going to have to do something different . No questions - verbalized understanding re - ABT and bladder pain meds -

## 2015-06-17 NOTE — Discharge Instructions (Signed)
Urinary Tract Infection °A urinary tract infection (UTI) can occur any place along the urinary tract. The tract includes the kidneys, ureters, bladder, and urethra. A type of germ called bacteria often causes a UTI. UTIs are often helped with antibiotic medicine.  °HOME CARE  °· If given, take antibiotics as told by your doctor. Finish them even if you start to feel better. °· Drink enough fluids to keep your pee (urine) clear or pale yellow. °· Avoid tea, drinks with caffeine, and bubbly (carbonated) drinks. °· Pee often. Avoid holding your pee in for a [place] time. °· Pee before and after having sex (intercourse). °· Wipe from front to back after you poop (bowel movement) if you are a woman. Use each tissue only once. °GET HELP RIGHT AWAY IF:  °· You have back pain. °· You have lower belly (abdominal) pain. °· You have chills. °· You feel sick to your stomach (nauseous). °· You throw up (vomit). °· Your burning or discomfort with peeing does not go away. °· You have a fever. °· Your symptoms are not better in 3 days. °MAKE SURE YOU:  °· Understand these instructions. °· Will watch your condition. °· Will get help right away if you are not doing well or get worse. °  °This information is not intended to replace advice given to you by your health care provider. Make sure you discuss any questions you have with your health care provider. °  °Document Released: 12/11/2007 Document Revised: 07/15/2014 Document Reviewed: 01/23/2012 °Elsevier Interactive Patient Education ©2016 Elsevier Inc. ° °

## 2015-06-17 NOTE — ED Provider Notes (Signed)
CSN: [id number]     Arrival date & time 06/17/15  1657 History   First MD Initiated Contact with Patient 06/17/15 1710     Chief Complaint  Patient presents with  . Urinary Tract Infection     (Consider location/radiation/quality/duration/timing/severity/associated sxs/prior Treatment) The history is provided by the patient.   [name] [name] is a 64 y.o. female presenting with a 3 day history of burning pain and pressure with urination.  She reports having a history of infrequent episodes of uti, last occuring one year ago and treated with bactrim.  She reports she seems to get one every time she uses the bathroom at work which she tries not to do. She denies nausea, vomiting, flank or back pain and has had no fevers or chills.  Her urine has been darker and more pungent than normal.  She denies hematuria.  She has taken no medicines prior to arrival for her symptoms.     Past Medical History  Diagnosis Date  . Depression   . Glucose intolerance (impaired glucose tolerance)   . Hyperlipidemia   . Hypertension   . Chronic renal insufficiency   . Adenomatous colon polyp 2007  . Diverticulosis 2010  . Tubular adenoma of colon 11/2011   Past Surgical History  Procedure Laterality Date  . Appendectomy    . Replacement total knee  2002  . Ileocolonoscopy  08/22/2008    Rourk- Friable anal canal, otherwise normal-appearing rectal mucosa, early shallow left-sided diverticula, colonic mucosa, and  terminal ileal mucosa appeared normal.   . Colonoscopy  08/02/2005    Rourk-Internal hemorrhoids, otherwise normal rectum. adenoma  . Colonoscopy  11/14/2011    RMR: Friable anal rectum-likely source of hematochezia Early sigmoid diverticular changes. Colonic tubular adenoma removed   . Colonoscopy N/A 05/17/2015    Procedure: COLONOSCOPY;  Surgeon: Daneil Dolin, MD;  [facility];  Service: Endoscopy;  Laterality: N/A;  10:15   Family History  Problem Relation Age of Onset  .  Colon cancer Brother 71  . Lung cancer Brother   . Colon cancer Father 40  . Colon cancer Brother 60   Social History  Substance Use Topics  . Smoking status: Never Smoker   . Smokeless tobacco: Never Used  . Alcohol Use: No   OB History    Gravida Para Term Preterm AB TAB SAB Ectopic Multiple Living   3 2 1 1 1  1   2      Review of Systems  Constitutional: Negative for fever.  HENT: Negative for congestion and sore throat.   Eyes: Negative.   Respiratory: Negative for chest tightness and shortness of breath.   Cardiovascular: Negative for chest pain.  Gastrointestinal: Negative for nausea and abdominal pain.  Genitourinary: Positive for dysuria. Negative for frequency, hematuria, flank pain, vaginal discharge and menstrual problem.  Musculoskeletal: Negative for joint swelling, arthralgias and neck pain.  Skin: Negative.  Negative for rash and wound.  Neurological: Negative for dizziness, weakness, light-headedness, numbness and headaches.  Psychiatric/Behavioral: Negative.       Allergies  Ketorolac tromethamine and Latex  Home Medications   Prior to Admission medications   Medication Sig Start Date End Date Taking? Authorizing Provider  ibuprofen (ADVIL,MOTRIN) 200 MG tablet Take 800 mg by mouth every 6 (six) hours as needed for mild pain or moderate pain.   Yes Historical Provider, MD  lisinopril (PRINIVIL,ZESTRIL) 5 MG tablet Take 5 mg by mouth daily.    Yes Historical Provider, MD  ibuprofen (ADVIL,MOTRIN) 800 MG tablet Take 1 tablet (800 mg total) by mouth every 8 (eight) hours as needed. Patient not taking: Reported on 06/17/2015 07/21/14   Carole Civil, MD  phenazopyridine (PYRIDIUM) 200 MG tablet Take 1 tablet (200 mg total) by mouth 3 (three) times daily. 06/17/15   Evalee Jefferson, PA-C  polyethylene glycol-electrolytes (NULYTELY/GOLYTELY) 420 G solution Take 4,000 mLs by mouth once. Patient not taking: Reported on 06/17/2015 05/12/15   Orvil Feil, NP   sulfamethoxazole-trimethoprim (BACTRIM DS,SEPTRA DS) 800-160 MG tablet Take 1 tablet by mouth 2 (two) times daily. 06/17/15 06/24/15  Evalee Jefferson, PA-C   BP 155/79 mmHg  Pulse 60  Temp(Src) 98.1 F (36.7 C) (Oral)  Resp 16  Ht 5\' 7"  (1.702 m)  Wt 89.812 kg  BMI 31.00 kg/m2  SpO2 100% Physical Exam  Constitutional: She appears well-developed and well-nourished.  HENT:  Head: Normocephalic and atraumatic.  Eyes: Conjunctivae are normal.  Neck: Normal range of motion.  Cardiovascular: Normal rate, regular rhythm, normal heart sounds and intact distal pulses.   Pulmonary/Chest: Effort normal and breath sounds normal. She has no wheezes.  Abdominal: Soft. Bowel sounds are normal. There is no tenderness. There is no guarding.  Musculoskeletal: Normal range of motion.  Neurological: She is alert.  Skin: Skin is warm and dry.  Psychiatric: She has a normal mood and affect.  Nursing note and vitals reviewed.   ED Course  Procedures (including critical care time) Labs Review Labs Reviewed  URINALYSIS, ROUTINE W REFLEX MICROSCOPIC (NOT AT [facility]) - Abnormal; Notable for the following:    Leukocytes, UA TRACE (*)    All other components within normal limits  URINE MICROSCOPIC-ADD ON - Abnormal; Notable for the following:    Squamous Epithelial / LPF 0-5 (*)    Bacteria, UA FEW (*)    All other components within normal limits  URINE CULTURE    Imaging Review No results found. I have personally reviewed and evaluated these images and lab results as part of my medical decision-making.   EKG Interpretation None      MDM   Final diagnoses:  UTI (lower urinary tract infection)    Dysuria with wbc's, few bacteria on UA.  Culture sent.  Will tx with bactrim, pyridium.  Pt denies vaginal dc.  Not sexually active.  Advised f/u with pcp if sx persist or do not improve with tx.  Pt aware cx pending.    Evalee Jefferson, PA-C 06/17/15 1815  Merrily Pew, MD 06/18/15 [phone]

## 2015-06-17 NOTE — ED Notes (Addendum)
PT c/o urinary burning and pressure x3 days. PT denies any n/v/d. PT states hx of UTI. PT also denies any fevers.

## 2015-06-21 LAB — URINE CULTURE
Culture: 20000
SPECIAL REQUESTS: NORMAL

## 2015-06-22 ENCOUNTER — Telehealth (HOSPITAL_BASED_OUTPATIENT_CLINIC_OR_DEPARTMENT_OTHER): Payer: Self-pay | Admitting: Emergency Medicine

## 2015-06-22 NOTE — Telephone Encounter (Signed)
Post ED Visit - Positive Culture Follow-up  Culture report reviewed by antimicrobial stewardship pharmacist:  [x]  Elenor Quinones, Pharm.D. []  Heide Guile, Pharm.D., BCPS []  Parks Neptune, Pharm.D. []  Alycia Rossetti, Pharm.D., BCPS []  [place], [place].D., BCPS, AAHIVP []  Legrand Como, Pharm.D., BCPS, AAHIVP []  Milus Glazier, Pharm.D. []  Stephens November, Pharm.D.  Positive urine culture E. coli Treated with bactrim DS, organism sensitive to the same and no further patient follow-up is required at this time.  Hazle Nordmann 06/22/2015, 10:16 AM

## 2015-07-03 ENCOUNTER — Encounter (HOSPITAL_COMMUNITY): Payer: Self-pay | Admitting: *Deleted

## 2015-07-03 ENCOUNTER — Emergency Department (HOSPITAL_COMMUNITY)
Admission: EM | Admit: 2015-07-03 | Discharge: 2015-07-03 | Disposition: A | Discharge status: Home / Self Care | Payer: Medicaid Other | Attending: Emergency Medicine | Admitting: Emergency Medicine

## 2015-07-03 DIAGNOSIS — R1310 Dysphagia, unspecified: Secondary | ICD-10-CM | POA: Insufficient documentation

## 2015-07-03 DIAGNOSIS — Z86018 Personal history of other benign neoplasm: Secondary | ICD-10-CM | POA: Diagnosis not present

## 2015-07-03 DIAGNOSIS — Z79899 Other long term (current) drug therapy: Secondary | ICD-10-CM | POA: Diagnosis not present

## 2015-07-03 DIAGNOSIS — Z8639 Personal history of other endocrine, nutritional and metabolic disease: Secondary | ICD-10-CM | POA: Diagnosis not present

## 2015-07-03 DIAGNOSIS — R0989 Other specified symptoms and signs involving the circulatory and respiratory systems: Secondary | ICD-10-CM | POA: Insufficient documentation

## 2015-07-03 DIAGNOSIS — Z91040 Latex allergy status: Secondary | ICD-10-CM | POA: Insufficient documentation

## 2015-07-03 DIAGNOSIS — N189 Chronic kidney disease, unspecified: Secondary | ICD-10-CM | POA: Diagnosis not present

## 2015-07-03 DIAGNOSIS — I129 Hypertensive chronic kidney disease with stage 1 through stage 4 chronic kidney disease, or unspecified chronic kidney disease: Secondary | ICD-10-CM | POA: Diagnosis not present

## 2015-07-03 DIAGNOSIS — Z8719 Personal history of other diseases of the digestive system: Secondary | ICD-10-CM | POA: Insufficient documentation

## 2015-07-03 NOTE — ED Notes (Signed)
Pt reports she drank some Mtn. Dew about 1400 and her pain has now went away. Pt no longer is feeling the pain in her throat.

## 2015-07-03 NOTE — ED Notes (Signed)
Pt states she was eating chicken last night and believes she swallowed a bone. Pt went to [facility] and had xray done where they didn't see anything on xray. Pt states she was discharged and told they didn't have a GI doctor to assess her. She comes here because the problem persists. Pt denies any trouble breathing.

## 2015-07-03 NOTE — Discharge Instructions (Signed)

## 2015-07-03 NOTE — ED Provider Notes (Signed)
CSN: [id number]     Arrival date & time 07/03/15  1220 History   First MD Initiated Contact with Patient 07/03/15 1505     Chief Complaint  Patient presents with  . Dysphagia  . Sore Throat      Patient is a 64 y.o. female presenting with pharyngitis. The history is provided by the patient.  Sore Throat This is a new problem. The current episode started yesterday. The problem occurs constantly. The problem has been resolved. Pertinent negatives include no chest pain, no abdominal pain and no shortness of breath. The symptoms are aggravated by swallowing. The symptoms are relieved by rest.  pt reports eating chicken last night about 8pm and felt she swallowed a bone She started to choke and had difficulty breathing No syncope EMS was called and apparently pt required heimlich maneuver upon their arrival She was taken to [facility] ER She improved and had neck xray that she was told was negative She continued to have painful swallowing until about one hour ago it resolved after drinking mountain dew. She now has no complaints No cp/sob No abd pain No hematemesis   Past Medical History  Diagnosis Date  . Depression   . Glucose intolerance (impaired glucose tolerance)   . Hyperlipidemia   . Hypertension   . Chronic renal insufficiency   . Adenomatous colon polyp 2007  . Diverticulosis 2010  . Tubular adenoma of colon 11/2011   Past Surgical History  Procedure Laterality Date  . Appendectomy    . Replacement total knee  2002  . Ileocolonoscopy  08/22/2008    Rourk- Friable anal canal, otherwise normal-appearing rectal mucosa, early shallow left-sided diverticula, colonic mucosa, and  terminal ileal mucosa appeared normal.   . Colonoscopy  08/02/2005    Rourk-Internal hemorrhoids, otherwise normal rectum. adenoma  . Colonoscopy  11/14/2011    RMR: Friable anal rectum-likely source of hematochezia Early sigmoid diverticular changes. Colonic tubular adenoma removed   . Colonoscopy  N/A 05/17/2015    Procedure: COLONOSCOPY;  Surgeon: Daneil Dolin, MD;  [facility];  Service: Endoscopy;  Laterality: N/A;  10:15   Family History  Problem Relation Age of Onset  . Colon cancer Brother 59  . Lung cancer Brother   . Colon cancer Father 56  . Colon cancer Brother 45   Social History  Substance Use Topics  . Smoking status: Never Smoker   . Smokeless tobacco: Never Used  . Alcohol Use: No   OB History    Gravida Para Term Preterm AB TAB SAB Ectopic Multiple Living   3 2 1 1 1  1   2      Review of Systems  Constitutional: Negative for fever.  Respiratory: Negative for shortness of breath.   Cardiovascular: Negative for chest pain.  Gastrointestinal: Negative for vomiting and abdominal pain.  Neurological: Negative for syncope.  All other systems reviewed and are negative.     Allergies  Ketorolac tromethamine and Latex  Home Medications   Prior to Admission medications   Medication Sig Start Date End Date Taking? Authorizing Provider  ibuprofen (ADVIL,MOTRIN) 200 MG tablet Take 800 mg by mouth every 6 (six) hours as needed for mild pain or moderate pain.    Historical Provider, MD  ibuprofen (ADVIL,MOTRIN) 800 MG tablet Take 1 tablet (800 mg total) by mouth every 8 (eight) hours as needed. Patient not taking: Reported on 06/17/2015 07/21/14   Carole Civil, MD  lisinopril (PRINIVIL,ZESTRIL) 5 MG tablet Take 5  mg by mouth daily.     Historical Provider, MD  phenazopyridine (PYRIDIUM) 200 MG tablet Take 1 tablet (200 mg total) by mouth 3 (three) times daily. 06/17/15   Evalee Jefferson, PA-C  polyethylene glycol-electrolytes (NULYTELY/GOLYTELY) 420 G solution Take 4,000 mLs by mouth once. Patient not taking: Reported on 06/17/2015 05/12/15   Orvil Feil, NP   BP 121/70 mmHg  Pulse 59  Temp(Src) 98 F (36.7 C) (Oral)  Resp 16  Ht 5\' 6"  (1.676 m)  Wt 95.255 kg  BMI 33.91 kg/m2  SpO2 100% Physical Exam CONSTITUTIONAL: Well developed/well  nourished HEAD: Normocephalic/atraumatic EYES: EOMI/PERRL ENMT: Mucous membranes moist, uvula midline, no erythema/exudates noted.  No stridor.  No foreign body noted NECK: supple no meningeal signs, no crepitus noted to neck SPINE/BACK:entire spine nontender CV: S1/S2 noted, no murmurs/rubs/gallops noted LUNGS: Lungs are clear to auscultation bilaterally, no apparent distress ABDOMEN: soft, nontender NEURO: Pt is awake/alert/appropriate, moves all extremitiesx4.   EXTREMITIES: pulses normal/equal, full ROM SKIN: warm, color normal PSYCH: no abnormalities of mood noted, alert and oriented to situation  ED Course  Procedures  Pt is now symptoms free No cp/sob/abd pain No odynophagia Discussed ER return precautions Defer workup at this time  MDM   Final diagnoses:  Odynophagia    Nursing notes including past medical history and social history reviewed and considered in documentation     Ripley Fraise, MD 07/03/15 1535

## 2015-07-12 ENCOUNTER — Ambulatory Visit: Payer: Medicaid Other | Admitting: Gastroenterology

## 2015-07-12 ENCOUNTER — Telehealth: Payer: Self-pay | Admitting: Gastroenterology

## 2015-07-12 ENCOUNTER — Encounter: Payer: Self-pay | Admitting: Gastroenterology

## 2015-07-12 NOTE — Telephone Encounter (Signed)
PATIENT WAS A NO SHOW AND LETTER SENT  °

## 2015-09-23 ENCOUNTER — Emergency Department (HOSPITAL_COMMUNITY)
Admission: EM | Admit: 2015-09-23 | Discharge: 2015-09-23 | Disposition: A | Discharge status: Home / Self Care | Payer: Medicaid Other | Attending: Emergency Medicine | Admitting: Emergency Medicine

## 2015-09-23 ENCOUNTER — Encounter (HOSPITAL_COMMUNITY): Payer: Self-pay | Admitting: Emergency Medicine

## 2015-09-23 DIAGNOSIS — E785 Hyperlipidemia, unspecified: Secondary | ICD-10-CM | POA: Insufficient documentation

## 2015-09-23 DIAGNOSIS — I10 Essential (primary) hypertension: Secondary | ICD-10-CM | POA: Diagnosis not present

## 2015-09-23 DIAGNOSIS — M62830 Muscle spasm of back: Secondary | ICD-10-CM | POA: Diagnosis not present

## 2015-09-23 DIAGNOSIS — F329 Major depressive disorder, single episode, unspecified: Secondary | ICD-10-CM | POA: Insufficient documentation

## 2015-09-23 DIAGNOSIS — M545 Low back pain: Secondary | ICD-10-CM | POA: Diagnosis present

## 2015-09-23 HISTORY — DX: Other intervertebral disc degeneration, lumbar region: M51.36

## 2015-09-23 HISTORY — DX: Other intervertebral disc degeneration, lumbar region without mention of lumbar back pain or lower extremity pain: M51.369

## 2015-09-23 MED ORDER — HYDROCODONE-ACETAMINOPHEN 5-325 MG PO TABS: 1.0000 | ORAL_TABLET | ORAL | Status: DC | PRN

## 2015-09-23 MED ORDER — METHOCARBAMOL 500 MG PO TABS: 500.0000 mg | ORAL_TABLET | Freq: Four times a day (QID) | ORAL | Status: DC

## 2015-09-23 MED ORDER — TRAMADOL HCL 50 MG PO TABS: 50.0000 mg | ORAL_TABLET | Freq: Four times a day (QID) | ORAL | Status: DC | PRN

## 2015-09-23 MED ORDER — METHOCARBAMOL 500 MG PO TABS: 500.0000 mg | ORAL_TABLET | Freq: Four times a day (QID) | ORAL | Status: AC

## 2015-09-23 NOTE — Discharge Instructions (Signed)
Back Injury Prevention Back injuries can be very painful. They can also be difficult to heal. After having one back injury, you are more likely to injure your back again. It is important to learn how to avoid injuring or re-injuring your back. The following tips can help you to prevent a back injury. WHAT SHOULD I KNOW ABOUT PHYSICAL FITNESS?  Exercise for 30 minutes per day on most days of the week or as directed by your health care provider. Make sure to:  Do aerobic exercises, such as walking, jogging, biking, or swimming.  Do exercises that increase balance and strength, such as tai chi and yoga. These can decrease your risk of falling and injuring your back.  Do stretching exercises to help with flexibility.  Try to develop strong abdominal muscles. Your abdominal muscles provide a lot of the support that is needed by your back.  Maintain a healthy weight. This helps to decrease your risk of a back injury. WHAT SHOULD I KNOW ABOUT MY DIET?  Talk with your health care provider about your overall diet. Take supplements and vitamins only as directed by your health care provider.  Talk with your health care provider about how much calcium and vitamin D you need each day. These nutrients help to prevent weakening of the bones (osteoporosis). Osteoporosis can cause broken (fractured) bones, which lead to back pain.  Include good sources of calcium in your diet, such as dairy products, green leafy vegetables, and products that have had calcium added to them (fortified).  Include good sources of vitamin D in your diet, such as milk and foods that are fortified with vitamin D. WHAT SHOULD I KNOW ABOUT MY POSTURE?  Sit up straight and stand up straight. Avoid leaning forward when you sit or hunching over when you stand.  Choose chairs that have good low-back (lumbar) support.  If you work at a desk, sit close to it so you do not need to lean over. Keep your chin tucked in. Keep your neck  drawn back, and keep your elbows bent at a right angle. Your arms should look like the letter "L."  Sit high and close to the steering wheel when you drive. Add a lumbar support to your car seat, if needed.  Avoid sitting or standing in one position for very [place]. Take breaks to get up, stretch, and walk around at least one time every hour. Take breaks every hour if you are driving for [place] periods of time.  Sleep on your side with your knees slightly bent, or sleep on your back with a pillow under your knees. Do not lie on the front of your body to sleep. WHAT SHOULD I KNOW ABOUT LIFTING, TWISTING, AND REACHING? Lifting and Heavy Lifting  Avoid heavy lifting, especially repetitive heavy lifting. If you must do heavy lifting:  Stretch before lifting.  Work slowly.  Rest between lifts.  Use a tool such as a cart or a dolly to move objects if one is available.  Make several small trips instead of carrying one heavy load.  Ask for help when you need it, especially when moving big objects.  Follow these steps when lifting:  Stand with your feet shoulder-width apart.  Get as close to the object as you can. Do not try to pick up a heavy object that is far from your body.  Use handles or lifting straps if they are available.  Bend at your knees. Squat down, but keep your heels off the floor.  Keep your shoulders pulled back, your chin tucked in, and your back straight.  Lift the object slowly while you tighten the muscles in your legs, abdomen, and buttocks. Keep the object as close to the center of your body as possible.  Follow these steps when putting down a heavy load:  Stand with your feet shoulder-width apart.  Lower the object slowly while you tighten the muscles in your legs, abdomen, and buttocks. Keep the object as close to the center of your body as possible.  Keep your shoulders pulled back, your chin tucked in, and your back straight.  Bend at your knees. Squat  down, but keep your heels off the floor.  Use handles or lifting straps if they are available. Twisting and Reaching  Avoid lifting heavy objects above your waist.  Do not twist at your waist while you are lifting or carrying a load. If you need to turn, move your feet.  Do not bend over without bending at your knees.  Avoid reaching over your head, across a table, or for an object on a high surface. WHAT ARE SOME OTHER TIPS?  Avoid wet floors and icy ground. Keep sidewalks clear of ice to prevent falls.  Do not sleep on a mattress that is too soft or too hard.  Keep items that are used frequently within easy reach.  Put heavier objects on shelves at waist level, and put lighter objects on lower or higher shelves.  Find ways to decrease your stress, such as exercise, massage, or relaxation techniques. Stress can build up in your muscles. Tense muscles are more vulnerable to injury.  Talk with your health care provider if you feel anxious or depressed. These conditions can make back pain worse.  Wear flat heel shoes with cushioned soles.  Avoid sudden movements.  Use both shoulder straps when carrying a backpack.  Do not use any tobacco products, including cigarettes, chewing tobacco, or electronic cigarettes. If you need help quitting, ask your health care provider.   This information is not intended to replace advice given to you by your health care provider. Make sure you discuss any questions you have with your health care provider.   Document Released: 08/01/2004 Document Revised: 11/08/2014 Document Reviewed: 06/28/2014 Elsevier Interactive Patient Education 2016 [place] not drive within 4 hours of taking your medicines as they will make you drowsy.  Avoid lifting,  Bending,  Twisting or any other activity that worsens your pain over the next week.  Apply an  icepack  to your lower back for 10-15 minutes every 2 hours for the next day.  You may add a  heating pad to your back for 20 minutes 3 times daily starting on Monday.  You should get rechecked if your symptoms are not better over the next 5 days,  Or you develop increased pain,  Weakness in your leg(s) or loss of bladder or bowel function - these are symptoms of a worse injury.

## 2015-09-23 NOTE — ED Provider Notes (Signed)
CSN: [id number]     Arrival date & time 09/23/15  1821 History   First MD Initiated Contact with Patient 09/23/15 1833     Chief Complaint  Patient presents with  . Back Pain     (Consider location/radiation/quality/duration/timing/severity/associated sxs/prior Treatment) The history is provided by the patient.   [name] [name] is a 65 y.o. 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  She felt a sudden muscle spasm in the left lower back which has been constant and persistent despite ibuprofen and rest.  She denies midline back pain and denies dysuria, hematuria,radiation of pain and has no weakness in her legs, no urinary retention or incontinence.  She does report having ddd but has had no signifcant problems from this in recent years.  She has found no alleviators for her symptoms.    Past Medical History  Diagnosis Date  . Depression   . Glucose intolerance (impaired glucose tolerance)   . Hyperlipidemia   . Hypertension   . Chronic renal insufficiency   . Adenomatous colon polyp 2007  . Diverticulosis 2010  . Tubular adenoma of colon 11/2011  . DDD (degenerative disc disease), lumbar    Past Surgical History  Procedure Laterality Date  . Appendectomy    . Replacement total knee  2002  . Ileocolonoscopy  08/22/2008    Rourk- Friable anal canal, otherwise normal-appearing rectal mucosa, early shallow left-sided diverticula, colonic mucosa, and  terminal ileal mucosa appeared normal.   . Colonoscopy  08/02/2005    Rourk-Internal hemorrhoids, otherwise normal rectum. adenoma  . Colonoscopy  11/14/2011    RMR: Friable anal rectum-likely source of hematochezia Early sigmoid diverticular changes. Colonic tubular adenoma removed   . Colonoscopy N/A 05/17/2015    RMR: Internal hemorrhoids likely source of hematochezia in the setting of significant constipation. Colonic  diverticulosis.    Family History  Problem Relation Age of Onset  . Colon cancer Brother 35  . Lung cancer Brother   . Colon cancer Father 16  . Colon cancer Brother 2   Social History  Substance Use Topics  . Smoking status: Never Smoker   . Smokeless tobacco: Never Used  . Alcohol Use: No   OB History    Gravida Para Term Preterm AB TAB SAB Ectopic Multiple Living   3 2 1 1 1  1   2      Review of Systems  Constitutional: Negative for fever.  Respiratory: Negative for shortness of breath.   Cardiovascular: Negative for chest pain and leg swelling.  Gastrointestinal: Negative for abdominal pain, constipation and abdominal distention.  Genitourinary: Negative for dysuria, urgency, frequency, flank pain and difficulty urinating.  Musculoskeletal: Positive for back pain. Negative for joint swelling and gait problem.  Skin: Negative for rash.  Neurological: Negative for weakness and numbness.      Allergies  Ketorolac tromethamine; Vicodin; and Latex  Home Medications   Prior to Admission medications   Medication Sig Start Date End Date Taking? Authorizing Provider  ibuprofen (ADVIL,MOTRIN) 200 MG tablet Take 800 mg by mouth every 6 (six) hours as needed for mild pain or moderate pain.    Historical Provider, MD  ibuprofen (ADVIL,MOTRIN) 800 MG tablet Take 1 tablet (800 mg total) by mouth every 8 (eight) hours as needed. Patient not taking: Reported on 06/17/2015 07/21/14   Carole Civil, MD  lisinopril (PRINIVIL,ZESTRIL)  5 MG tablet Take 5 mg by mouth daily.     Historical Provider, MD  methocarbamol (ROBAXIN) 500 MG tablet Take 1-2 tablets (500-1,000 mg total) by mouth 4 (four) times daily. 09/23/15 10/03/15  Evalee Jefferson, PA-C  phenazopyridine (PYRIDIUM) 200 MG tablet Take 1 tablet (200 mg total) by mouth 3 (three) times daily. 06/17/15   Evalee Jefferson, PA-C  polyethylene glycol-electrolytes (NULYTELY/GOLYTELY) 420 G solution Take 4,000 mLs by mouth once. Patient not  taking: Reported on 06/17/2015 05/12/15   Orvil Feil, NP  traMADol (ULTRAM) 50 MG tablet Take 1 tablet (50 mg total) by mouth every 6 (six) hours as needed. 09/23/15   Evalee Jefferson, PA-C   BP 159/82 mmHg  Pulse 64  Temp(Src) 98 F (36.7 C) (Oral)  Resp 20  Ht 5\' 6"  (1.676 m)  Wt 96.163 kg  BMI 34.23 kg/m2  SpO2 100% Physical Exam  Constitutional: She appears well-developed and well-nourished.  HENT:  Head: Normocephalic.  Eyes: Conjunctivae are normal.  Neck: Normal range of motion. Neck supple.  Cardiovascular: Normal rate and intact distal pulses.   Pedal pulses normal.  Pulmonary/Chest: Effort normal.  Abdominal: Soft. Bowel sounds are normal. She exhibits no distension and no mass.  Musculoskeletal: Normal range of motion. She exhibits no edema.       Lumbar back: She exhibits tenderness and spasm. She exhibits no bony tenderness, no swelling and no edema.       Back:  ttp and spasm appreciated left paralumbar back.  No midline ttp. Negative SLR.  Neurological: She is alert. She has normal strength. She displays no atrophy and no tremor. No sensory deficit. Gait normal.  Reflex Scores:      Patellar reflexes are 2+ on the right side and 2+ on the left side.      Achilles reflexes are 2+ on the right side and 2+ on the left side. No strength deficit noted in hip and knee flexor and extensor muscle groups.  Ankle flexion and extension intact.  Skin: Skin is warm and dry.  Psychiatric: She has a normal mood and affect.  Nursing note and vitals reviewed.   ED Course  Procedures (including critical care time) Labs Review Labs Reviewed - No data to display  Imaging Review No results found. I have personally reviewed and evaluated these images and lab results as part of my medical decision-making.   EKG Interpretation None      MDM   Final diagnoses:  Lumbar paraspinal muscle spasm    No neuro deficit on exam or by history to suggest emergent or surgical  presentation.  Also discussed worsened sx that should prompt immediate re-evaluation including distal weakness, bowel/bladder retention/incontinence.  Continue ibuprofen, added robaxin, tramadol.  Ice tx, heat tx, activity as tolerated.  F/u with pcp for a recheck if not improved over the next week.        Evalee Jefferson, PA-C 09/23/15 2054  Milton Ferguson, MD 09/23/15 2239

## 2015-09-23 NOTE — ED Notes (Signed)
PT c/o lower back pain x3 days after picking up an object and states hx of back problems. PT denies any urinary symptoms.

## 2016-03-24 ENCOUNTER — Encounter (HOSPITAL_COMMUNITY): Payer: Self-pay | Admitting: Emergency Medicine

## 2016-03-24 ENCOUNTER — Emergency Department (HOSPITAL_COMMUNITY): Payer: Medicaid Other

## 2016-03-24 ENCOUNTER — Emergency Department (HOSPITAL_COMMUNITY)
Admission: EM | Admit: 2016-03-24 | Discharge: 2016-03-25 | Disposition: A | Discharge status: Home / Self Care | Payer: Medicaid Other | Attending: Emergency Medicine | Admitting: Emergency Medicine

## 2016-03-24 DIAGNOSIS — W010XXA Fall on same level from slipping, tripping and stumbling without subsequent striking against object, initial encounter: Secondary | ICD-10-CM | POA: Diagnosis not present

## 2016-03-24 DIAGNOSIS — Y999 Unspecified external cause status: Secondary | ICD-10-CM | POA: Insufficient documentation

## 2016-03-24 DIAGNOSIS — Z79899 Other long term (current) drug therapy: Secondary | ICD-10-CM | POA: Insufficient documentation

## 2016-03-24 DIAGNOSIS — I10 Essential (primary) hypertension: Secondary | ICD-10-CM | POA: Diagnosis not present

## 2016-03-24 DIAGNOSIS — Z791 Long term (current) use of non-steroidal anti-inflammatories (NSAID): Secondary | ICD-10-CM | POA: Diagnosis not present

## 2016-03-24 DIAGNOSIS — Y939 Activity, unspecified: Secondary | ICD-10-CM | POA: Diagnosis not present

## 2016-03-24 DIAGNOSIS — Y92009 Unspecified place in unspecified non-institutional (private) residence as the place of occurrence of the external cause: Secondary | ICD-10-CM | POA: Insufficient documentation

## 2016-03-24 DIAGNOSIS — W19XXXA Unspecified fall, initial encounter: Secondary | ICD-10-CM

## 2016-03-24 DIAGNOSIS — S4992XA Unspecified injury of left shoulder and upper arm, initial encounter: Secondary | ICD-10-CM | POA: Diagnosis not present

## 2016-03-24 MED ORDER — NAPROXEN 375 MG PO TABS: 375.0000 mg | ORAL_TABLET | Freq: Two times a day (BID) | ORAL | 0 refills | Status: DC

## 2016-03-24 NOTE — ED Triage Notes (Signed)
Pt states she fell off her porch about an hour ago. Pt here with c/o L. Elbow and shoulder pain.

## 2016-03-24 NOTE — ED Provider Notes (Signed)
[facility] DEPT Provider Note   CSN: [id number] Arrival date & time: 03/24/16  2053     History   Chief Complaint Chief Complaint  Patient presents with  . Arm Injury    HPI [name] [name] is a 65 y.o. female.He presents with chief complaint of left shoulder and elbow pain. Patient states that she tripped on her front porch when her apparently got caught on a nail. She fell directly onto her left shoulder and arm. She's had pain and decreased range of motion in the arm since that time. She denies any numbness or tingling in the hand. No previous injuries to this area. She did not hit her head or lose consciousness.  HPI  Past Medical History:  Diagnosis Date  . Adenomatous colon polyp 2007  . Chronic renal insufficiency   . DDD (degenerative disc disease), lumbar   . Depression   . Diverticulosis 2010  . Glucose intolerance (impaired glucose tolerance)   . Hyperlipidemia   . Hypertension   . Tubular adenoma of colon 11/2011    Patient Active Problem List   Diagnosis Date Noted  . Rectal bleeding 05/12/2015  . Tubular adenoma of colon 07/30/2012  . Vomiting 07/30/2012  . Family history of colon cancer 10/15/2010  . MIXED HYPERLIPIDEMIA 04/03/2010  . ESSENTIAL HYPERTENSION, BENIGN 04/03/2010  . CHEST PAIN 04/03/2010  . HEMORRHOIDS 11/07/2009  . CONSTIPATION, CHRONIC 11/07/2009  . HEMATOCHEZIA 11/07/2009  . COLONIC POLYPS, ADENOMATOUS, HX OF 11/07/2009    Past Surgical History:  Procedure Laterality Date  . APPENDECTOMY    . COLONOSCOPY  08/02/2005   Rourk-Internal hemorrhoids, otherwise normal rectum. adenoma  . COLONOSCOPY  11/14/2011   RMR: Friable anal rectum-likely source of hematochezia Early sigmoid diverticular changes. Colonic tubular adenoma removed   . COLONOSCOPY N/A 05/17/2015   RMR: Internal hemorrhoids likely source of hematochezia in the setting of significant constipation. Colonic diverticulosis.   . Ileocolonoscopy  08/22/2008   Rourk- Friable  anal canal, otherwise normal-appearing rectal mucosa, early shallow left-sided diverticula, colonic mucosa, and  terminal ileal mucosa appeared normal.   . REPLACEMENT TOTAL KNEE  2002    OB History    Gravida Para Term Preterm AB Living   3 2 1 1 1 2    SAB TAB Ectopic Multiple Live Births   1               Home Medications    Prior to Admission medications   Medication Sig Start Date End Date Taking? Authorizing Provider  ibuprofen (ADVIL,MOTRIN) 200 MG tablet Take 800 mg by mouth every 6 (six) hours as needed for mild pain or moderate pain.    Historical Provider, MD  ibuprofen (ADVIL,MOTRIN) 800 MG tablet Take 1 tablet (800 mg total) by mouth every 8 (eight) hours as needed. Patient not taking: Reported on 06/17/2015 07/21/14   Carole Civil, MD  lisinopril (PRINIVIL,ZESTRIL) 5 MG tablet Take 5 mg by mouth daily.     Historical Provider, MD  phenazopyridine (PYRIDIUM) 200 MG tablet Take 1 tablet (200 mg total) by mouth 3 (three) times daily. 06/17/15   Evalee Jefferson, PA-C  polyethylene glycol-electrolytes (NULYTELY/GOLYTELY) 420 G solution Take 4,000 mLs by mouth once. Patient not taking: Reported on 06/17/2015 05/12/15   Annitta Needs, NP  traMADol (ULTRAM) 50 MG tablet Take 1 tablet (50 mg total) by mouth every 6 (six) hours as needed. 09/23/15   Evalee Jefferson, PA-C    Family History Family History  Problem Relation Age of  Onset  . Colon cancer Brother 27  . Lung cancer Brother   . Colon cancer Father 58  . Colon cancer Brother 60    Social History Social History  Substance Use Topics  . Smoking status: Never Smoker  . Smokeless tobacco: Never Used  . Alcohol use No     Allergies   Ketorolac tromethamine; Vicodin [hydrocodone-acetaminophen]; and Latex   Review of Systems Review of Systems  Eyes: Negative for visual disturbance.  Musculoskeletal: Positive for arthralgias and myalgias. Negative for joint swelling.  Skin: Negative for wound.  Neurological: Negative  for headaches.     Physical Exam Updated Vital Signs BP 162/74 (BP Location: Right Arm)   Pulse (!) 55   Temp 98.1 F (36.7 C) (Oral)   Resp 18   Ht 5\' 6"  (1.676 m)   Wt 92.5 kg   SpO2 99%   BMI 32.93 kg/m   Physical Exam  Constitutional: She is oriented to person, place, and time. She appears well-developed and well-nourished. No distress.  HENT:  Head: Normocephalic and atraumatic.  Eyes: Conjunctivae are normal. No scleral icterus.  Neck: Normal range of motion.  Cardiovascular: Normal rate, regular rhythm and normal heart sounds.  Exam reveals no gallop and no friction rub.   No murmur heard. Pulmonary/Chest: Effort normal and breath sounds normal. No respiratory distress.  Abdominal: Soft. Bowel sounds are normal. She exhibits no distension and no mass. There is no tenderness. There is no guarding.  Musculoskeletal:  Tender to palpation along the left humerus. Decreased range of motion with abduction and flexion of the shoulder, bony tenderness of the left elbow joint, however. Range of motion is normal. Normal radial pulse and sensation in the left arm. Contralateral arm exam is negative  Neurological: She is alert and oriented to person, place, and time.  Skin: Skin is warm and dry. She is not diaphoretic.     ED Treatments / Results  Labs (all labs ordered are listed, but only abnormal results are displayed) Labs Reviewed - No data to display  EKG  EKG Interpretation None       Radiology No results found.  Procedures Procedures (including critical care time)  Medications Ordered in ED Medications - No data to display   Initial Impression / Assessment and Plan / ED Course  I have reviewed the triage vital signs and the nursing notes.  Pertinent labs & imaging results that were available during my care of the patient were reviewed by me and considered in my medical decision making (see chart for details).  Clinical Course    Patient images  reviewed by me and I did not see any acute fractures or abnormalities. However, it has not been read by the radiologist. At this time. Radiology films have been taking his significantly [place] amount of time to name. Patient is unable to wait any longer, as her ride is here to take her home and she does not have another way home. Patient be discharged at this time. Given sling for comfort. She will follow up with orthopedics. Her safe for discharge at this time  Final Clinical Impressions(s) / ED Diagnoses   Final diagnoses:  None    New Prescriptions New Prescriptions   No medications on file     Margarita Mail, PA-C 03/26/16 0058    Veryl Speak, MD 03/26/16 [phone]

## 2016-03-24 NOTE — Discharge Instructions (Signed)
As you are leaving prior to the official read of your xray, I cannot rule out a fracture. Use the sling for comfort and take your arm and shoulder out several times daily to do range of motion exercises.  Please follow up with your doctor or the orthopedist if your symptoms are not improving.

## 2016-04-23 ENCOUNTER — Emergency Department (HOSPITAL_COMMUNITY): Payer: Medicaid Other

## 2016-04-23 ENCOUNTER — Emergency Department (HOSPITAL_COMMUNITY)
Admission: EM | Admit: 2016-04-23 | Discharge: 2016-04-24 | Disposition: A | Discharge status: Home / Self Care | Payer: Medicaid Other | Attending: Emergency Medicine | Admitting: Emergency Medicine

## 2016-04-23 ENCOUNTER — Encounter (HOSPITAL_COMMUNITY): Payer: Self-pay | Admitting: Emergency Medicine

## 2016-04-23 DIAGNOSIS — Z79899 Other long term (current) drug therapy: Secondary | ICD-10-CM | POA: Diagnosis not present

## 2016-04-23 DIAGNOSIS — R1031 Right lower quadrant pain: Secondary | ICD-10-CM | POA: Insufficient documentation

## 2016-04-23 DIAGNOSIS — I10 Essential (primary) hypertension: Secondary | ICD-10-CM | POA: Diagnosis not present

## 2016-04-23 DIAGNOSIS — Z791 Long term (current) use of non-steroidal anti-inflammatories (NSAID): Secondary | ICD-10-CM | POA: Diagnosis not present

## 2016-04-23 DIAGNOSIS — M549 Dorsalgia, unspecified: Secondary | ICD-10-CM | POA: Diagnosis not present

## 2016-04-23 LAB — URINALYSIS, ROUTINE W REFLEX MICROSCOPIC
Bilirubin Urine: NEGATIVE
Glucose, UA: NEGATIVE mg/dL
HGB URINE DIPSTICK: NEGATIVE
Ketones, ur: NEGATIVE mg/dL
Leukocytes, UA: NEGATIVE
Nitrite: NEGATIVE
Protein, ur: NEGATIVE mg/dL
SPECIFIC GRAVITY, URINE: 1.01 (ref 1.005–1.030)
pH: 6 (ref 5.0–8.0)

## 2016-04-23 LAB — CBC WITH DIFFERENTIAL/PLATELET
BASOS ABS: 0 10*3/uL (ref 0.0–0.1)
BASOS PCT: 0 %
EOS ABS: 0.2 10*3/uL (ref 0.0–0.7)
EOS PCT: 3 %
HCT: 38.6 % (ref 36.0–46.0)
Hemoglobin: 12.6 g/dL (ref 12.0–15.0)
Lymphocytes Relative: 27 %
Lymphs Abs: 1.9 10*3/uL (ref 0.7–4.0)
MCH: 30.5 pg (ref 26.0–34.0)
MCHC: 32.6 g/dL (ref 30.0–36.0)
MCV: 93.5 fL (ref 78.0–100.0)
MONO ABS: 0.4 10*3/uL (ref 0.1–1.0)
Monocytes Relative: 5 %
Neutro Abs: 4.5 10*3/uL (ref 1.7–7.7)
Neutrophils Relative %: 65 %
PLATELETS: 199 10*3/uL (ref 150–400)
RBC: 4.13 MIL/uL (ref 3.87–5.11)
RDW: 13.5 % (ref 11.5–15.5)
WBC: 7 10*3/uL (ref 4.0–10.5)

## 2016-04-23 LAB — COMPREHENSIVE METABOLIC PANEL
ALBUMIN: 4 g/dL (ref 3.5–5.0)
ALT: 20 U/L (ref 14–54)
AST: 19 U/L (ref 15–41)
Alkaline Phosphatase: 129 U/L — ABNORMAL HIGH (ref 38–126)
Anion gap: 6 (ref 5–15)
BUN: 20 mg/dL (ref 6–20)
CHLORIDE: 103 mmol/L (ref 101–111)
CO2: 27 mmol/L (ref 22–32)
Calcium: 9.2 mg/dL (ref 8.9–10.3)
Creatinine, Ser: 0.99 mg/dL (ref 0.44–1.00)
GFR calc Af Amer: >60 mL/min (ref >60)
GFR, EST NON AFRICAN AMERICAN: 59 mL/min — AB (ref >60)
GLUCOSE: 146 mg/dL — AB (ref 65–99)
POTASSIUM: 3.4 mmol/L — AB (ref 3.5–5.1)
SODIUM: 136 mmol/L (ref 135–145)
Total Bilirubin: 0.5 mg/dL (ref 0.3–1.2)
Total Protein: 7.3 g/dL (ref 6.5–8.1)

## 2016-04-23 MED ORDER — IOPAMIDOL (ISOVUE-300) INJECTION 61%
100.0000 mL | Freq: Once | INTRAVENOUS | Status: AC | PRN
  Administered 2016-04-24: 100 mL via INTRAVENOUS

## 2016-04-23 MED ORDER — IOPAMIDOL (ISOVUE-300) INJECTION 61%
INTRAVENOUS | Status: AC
  Filled 2016-04-23: qty 30

## 2016-04-23 MED ORDER — FENTANYL CITRATE (PF) 100 MCG/2ML IJ SOLN
50.0000 ug | Freq: Once | INTRAMUSCULAR | Status: AC
  Administered 2016-04-23: 50 ug via INTRAMUSCULAR
  Filled 2016-04-23: qty 2

## 2016-04-23 NOTE — ED Triage Notes (Signed)
Pt c/o R groin pain that started 2 days ago. Pt denies urinary symptoms. Pt denies n/v/d or fever. Pt state pain is worse with mvmt or coughing. Pt reports tenderness to touch.

## 2016-04-24 MED ORDER — TRAMADOL HCL 50 MG PO TABS: 50.0000 mg | ORAL_TABLET | Freq: Four times a day (QID) | ORAL | 0 refills | Status: DC | PRN

## 2016-04-24 MED ORDER — CEPHALEXIN 500 MG PO CAPS
500.0000 mg | ORAL_CAPSULE | Freq: Once | ORAL | Status: AC
  Administered 2016-04-24: 500 mg via ORAL
  Filled 2016-04-24: qty 1

## 2016-04-24 MED ORDER — CEPHALEXIN 500 MG PO CAPS: 500.0000 mg | ORAL_CAPSULE | Freq: Four times a day (QID) | ORAL | 0 refills | Status: DC

## 2016-04-24 NOTE — ED Provider Notes (Signed)
[facility] DEPT Provider Note   CSN: [id number] Arrival date & time: 04/23/16  2016     History   Chief Complaint Chief Complaint  Patient presents with  . Groin Pain    HPI [name] [name] is a 65 y.o. female.  HPI   [name] [name] is a 65 y.o. female who presents to the Emergency Department complaining of gradually worsening right groin pain.  Onset two days ago.  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  She reports pain with movement and walking.  Pain radiates into her right lower back. Improves somewhat at rest.  She denies fever, chills, abdominal pain, urinary symptoms.  No hx of kidney stones, prior appendectomy and stated complete hysterectomy.   Past Medical History:  Diagnosis Date  . Adenomatous colon polyp 2007  . Chronic renal insufficiency   . DDD (degenerative disc disease), lumbar   . Depression   . Diverticulosis 2010  . Glucose intolerance (impaired glucose tolerance)   . Hyperlipidemia   . Hypertension   . Tubular adenoma of colon 11/2011    Patient Active Problem List   Diagnosis Date Noted  . Rectal bleeding 05/12/2015  . Tubular adenoma of colon 07/30/2012  . Vomiting 07/30/2012  . Family history of colon cancer 10/15/2010  . MIXED HYPERLIPIDEMIA 04/03/2010  . ESSENTIAL HYPERTENSION, BENIGN 04/03/2010  . CHEST PAIN 04/03/2010  . HEMORRHOIDS 11/07/2009  . CONSTIPATION, CHRONIC 11/07/2009  . HEMATOCHEZIA 11/07/2009  . COLONIC POLYPS, ADENOMATOUS, HX OF 11/07/2009    Past Surgical History:  Procedure Laterality Date  . APPENDECTOMY    . COLONOSCOPY  08/02/2005   Rourk-Internal hemorrhoids, otherwise normal rectum. adenoma  . COLONOSCOPY  11/14/2011   RMR: Friable anal rectum-likely source of hematochezia Early sigmoid diverticular changes. Colonic tubular adenoma removed   . COLONOSCOPY N/A 05/17/2015   RMR: Internal hemorrhoids likely source of hematochezia in the setting of significant  constipation. Colonic diverticulosis.   . Ileocolonoscopy  08/22/2008   Rourk- Friable anal canal, otherwise normal-appearing rectal mucosa, early shallow left-sided diverticula, colonic mucosa, and  terminal ileal mucosa appeared normal.   . REPLACEMENT TOTAL KNEE  2002    OB History    Gravida Para Term Preterm AB Living   3 2 1 1 1 2    SAB TAB Ectopic Multiple Live Births   1               Home Medications    Prior to Admission medications   Medication Sig Start Date End Date Taking? Authorizing Provider  ibuprofen (ADVIL,MOTRIN) 200 MG tablet Take 200 mg by mouth every 6 (six) hours as needed for mild pain or moderate pain.   Yes Historical Provider, MD  lisinopril (PRINIVIL,ZESTRIL) 5 MG tablet Take 5 mg by mouth daily.    Yes Historical Provider, MD  methocarbamol (ROBAXIN) 500 MG tablet Take 500 mg by mouth daily as needed for muscle spasms.   Yes Historical Provider, MD  naproxen (NAPROSYN) 375 MG tablet Take 1 tablet (375 mg total) by mouth 2 (two) times daily. Patient not taking: Reported on 04/23/2016 03/24/16   Margarita Mail, PA-C    Family History Family History  Problem Relation Age of Onset  . Colon cancer Brother 9  . Lung cancer Brother   . Colon cancer Father 67  . Colon cancer Brother 72    Social History Social History  Substance Use Topics  . Smoking status: Never Smoker  .  Smokeless tobacco: Never Used  . Alcohol use No     Allergies   Ketorolac tromethamine; Vicodin [hydrocodone-acetaminophen]; and Latex   Review of Systems Review of Systems  Constitutional: Negative for fever.  Respiratory: Negative for shortness of breath.   Gastrointestinal: Negative for abdominal pain, constipation, nausea and vomiting.  Genitourinary: Negative for decreased urine volume, difficulty urinating, dysuria, flank pain and hematuria.  Musculoskeletal: Positive for back pain and myalgias (right groin pain). Negative for joint swelling.  Skin: Negative for  rash.  Neurological: Negative for dizziness, weakness and numbness.  All other systems reviewed and are negative.    Physical Exam Updated Vital Signs BP 154/83   Pulse (!) 53   Temp 97.8 F (36.6 C) (Oral)   Resp 18   Ht 5\' 6"  (1.676 m)   Wt 92.5 kg   SpO2 97%   BMI 32.93 kg/m   Physical Exam  Constitutional: She is oriented to person, place, and time. She appears well-developed and well-nourished. No distress.  HENT:  Head: Atraumatic.  Mouth/Throat: Oropharynx is clear and moist.  Cardiovascular: Normal rate, regular rhythm and intact distal pulses.   Pulmonary/Chest: Effort normal. No respiratory distress. She exhibits no tenderness.  Abdominal: Soft. Normal appearance and bowel sounds are normal. She exhibits no distension and no mass. There is tenderness in the right lower quadrant. There is no guarding and no CVA tenderness.    ttp of the right pelvis.  No guarding or rebound tenderness  Musculoskeletal: Normal range of motion. She exhibits no edema or deformity.  Pain to RLQ reproduced with SLR on right and with internal rotation.  DP pulse brisk, tenderness of the right lumbar paraspinal muscles.   Neurological: She is alert and oriented to person, place, and time.  Skin: Skin is warm and dry. No rash noted. No erythema.  Nursing note and vitals reviewed.    ED Treatments / Results  Labs (all labs ordered are listed, but only abnormal results are displayed) Labs Reviewed  COMPREHENSIVE METABOLIC PANEL - Abnormal; Notable for the following:       Result Value   Potassium 3.4 (*)    Glucose, Bld 146 (*)    Alkaline Phosphatase 129 (*)    GFR calc non Af Amer 59 (*)    All other components within normal limits  CBC WITH DIFFERENTIAL/PLATELET  URINALYSIS, ROUTINE W REFLEX MICROSCOPIC (NOT AT [facility])    EKG  EKG Interpretation None       Radiology Ct Abdomen Pelvis W Contrast  Result Date: 04/24/2016 CLINICAL DATA:  Acute onset of right groin pain and  tenderness. Initial encounter. EXAM: CT ABDOMEN AND PELVIS WITH CONTRAST TECHNIQUE: Multidetector CT imaging of the abdomen and pelvis was performed using the standard protocol following bolus administration of intravenous contrast. CONTRAST:  181mL ISOVUE-300 IOPAMIDOL (ISOVUE-300) INJECTION 61% COMPARISON:  CT of the abdomen and pelvis performed 10/30/2009 FINDINGS: Lower chest: A trace pericardial effusion is identified. The visualized lung bases are grossly clear. Hepatobiliary: The liver is unremarkable in appearance. The gallbladder is unremarkable in appearance. The common bile duct remains normal in caliber. Pancreas: The pancreas is within normal limits. Spleen: The spleen is unremarkable in appearance. Adrenals/Urinary Tract: The adrenal glands are unremarkable in appearance. Small cysts are noted near the lower pole of the right kidney Mild focal decreased enhancement at the lower pole of the right kidney could reflect mild pyelonephritis. A small right renal cyst is noted. There is no evidence of hydronephrosis. No renal or  ureteral stones are identified. No perinephric stranding is seen. Stomach/Bowel: The stomach is unremarkable in appearance. The small bowel is within normal limits. The patient is status post appendectomy. Mild diverticulosis is noted along the distal descending and proximal sigmoid colon, without evidence of diverticulitis. Vascular/Lymphatic: Minimal calcification is noted at the distal abdominal aorta and its branches. No retroperitoneal or pelvic sidewall lymphadenopathy is seen. Reproductive: The patient is status post hysterectomy. The bladder is mildly distended and grossly unremarkable. No suspicious adnexal masses are seen. Other: No additional soft tissue abnormalities are seen. Musculoskeletal: No acute osseous abnormalities are identified. Disc space narrowing is noted at L3-L4 and L5-S1, with associated vacuum phenomenon. The visualized musculature is unremarkable in  appearance. IMPRESSION: 1. Mild focal decreased enhancement at the lower pole of the right kidney could reflect mild pyelonephritis. Would correlate with the patient's labs. 2. Small right renal cyst noted. 3. Mild diverticulosis at the distal descending and proximal sigmoid colon, without evidence of diverticulitis. 4. Mild degenerative change at the lower lumbar spine. 5. Trace pericardial effusion noted. Electronically Signed   By: Garald Balding M.D.   On: 04/24/2016 00:58    Procedures Procedures (including critical care time)  Medications Ordered in ED Medications  iopamidol (ISOVUE-300) 61 % injection (not administered)  fentaNYL (SUBLIMAZE) injection 50 mcg (50 mcg Intramuscular Given 04/23/16 2250)  iopamidol (ISOVUE-300) 61 % injection 100 mL (100 mLs Intravenous Contrast Given 04/24/16 0000)     Initial Impression / Assessment and Plan / ED Course  I have reviewed the triage vital signs and the nursing notes.  Pertinent labs & imaging results that were available during my care of the patient were reviewed by me and considered in my medical decision making (see chart for details).  Clinical Course   Pt feeling better after medication.   CT and labs are reassuring.  Pain likely musculoskeletal.  Doubt surgical abdomen. Pt also seen by Dr. Lacinda Axon and care plan discussed.   She ambulates in the dept with steady gait.  No Focal neuro deficits.  Agrees to PMD f/u and symptomatic tx.    The patient appears reasonably screened and/or stabilized for discharge and I doubt any other medical condition or other [facility] requiring further screening, evaluation, or treatment in the ED at this time prior to discharge.     Final Clinical Impressions(s) / ED Diagnoses   Final diagnoses:  Groin pain, right    New Prescriptions New Prescriptions   No medications on file     Bufford Lope 04/26/16 1252    Nat Christen, MD 04/30/16 1332

## 2016-04-24 NOTE — Discharge Instructions (Signed)
Apply ice packs on/off to your groin.  Follow-up with your doctor or return here for any worsening symptoms

## 2016-04-25 LAB — URINE CULTURE: CULTURE: NO GROWTH

## 2016-07-07 ENCOUNTER — Emergency Department (HOSPITAL_COMMUNITY)
Admission: EM | Admit: 2016-07-07 | Discharge: 2016-07-07 | Disposition: A | Discharge status: Home / Self Care | Payer: Medicaid Other | Attending: Emergency Medicine | Admitting: Emergency Medicine

## 2016-07-07 ENCOUNTER — Encounter (HOSPITAL_COMMUNITY): Payer: Self-pay

## 2016-07-07 DIAGNOSIS — J039 Acute tonsillitis, unspecified: Secondary | ICD-10-CM | POA: Diagnosis not present

## 2016-07-07 DIAGNOSIS — Z79899 Other long term (current) drug therapy: Secondary | ICD-10-CM | POA: Diagnosis not present

## 2016-07-07 DIAGNOSIS — J029 Acute pharyngitis, unspecified: Secondary | ICD-10-CM | POA: Diagnosis present

## 2016-07-07 DIAGNOSIS — I129 Hypertensive chronic kidney disease with stage 1 through stage 4 chronic kidney disease, or unspecified chronic kidney disease: Secondary | ICD-10-CM | POA: Insufficient documentation

## 2016-07-07 DIAGNOSIS — N189 Chronic kidney disease, unspecified: Secondary | ICD-10-CM | POA: Insufficient documentation

## 2016-07-07 LAB — RAPID STREP SCREEN (MED CTR MEBANE ONLY): STREPTOCOCCUS, GROUP A SCREEN (DIRECT): NEGATIVE

## 2016-07-07 MED ORDER — AMOXICILLIN 500 MG PO CAPS: 500.0000 mg | ORAL_CAPSULE | Freq: Three times a day (TID) | ORAL | 0 refills | Status: DC

## 2016-07-07 NOTE — ED Triage Notes (Signed)
Sore throat x3 days. Denies fever. 

## 2016-07-07 NOTE — ED Notes (Signed)
Pt unable to sign due to computer issues.  Verbalized understanding and left with family.

## 2016-07-07 NOTE — ED Provider Notes (Signed)
[facility] DEPT Provider Note   CSN: [id number] Arrival date & time: 07/07/16  1435  By signing my name below, I, Ephriam Jenkins, attest that this documentation has been prepared under the direction and in the presence of Debroah Baller NP  Electronically Signed: Ephriam Jenkins, ED Scribe. 07/07/16. 4:38 PM.  History   Chief Complaint Chief Complaint  Patient presents with  . Sore Throat    HPI HPI Comments: [name] [name] is a 65 y.o. female who presents to the Emergency Department complaining of gradually worsening sore throat that has been persistent for the past 3 days. She reports exacerbating pain when swallowing but has not had difficulty swallowing. She has attempted to gargle salt water with no relief of symptoms. She has also taken ibuprofen which mildly relieved the pain. Daughter notes that the pt usually gets Tonsillitis once every year with these similar symptoms. No known sick contacts. No nausea, vomiting, fever. No headache or ear ache.  The history is provided by the patient and a relative. No language interpreter was used.    Past Medical History:  Diagnosis Date  . Adenomatous colon polyp 2007  . Chronic renal insufficiency   . DDD (degenerative disc disease), lumbar   . Depression   . Diverticulosis 2010  . Glucose intolerance (impaired glucose tolerance)   . Hyperlipidemia   . Hypertension   . Tubular adenoma of colon 11/2011    Patient Active Problem List   Diagnosis Date Noted  . Rectal bleeding 05/12/2015  . Tubular adenoma of colon 07/30/2012  . Vomiting 07/30/2012  . Family history of colon cancer 10/15/2010  . MIXED HYPERLIPIDEMIA 04/03/2010  . ESSENTIAL HYPERTENSION, BENIGN 04/03/2010  . CHEST PAIN 04/03/2010  . HEMORRHOIDS 11/07/2009  . CONSTIPATION, CHRONIC 11/07/2009  . HEMATOCHEZIA 11/07/2009  . COLONIC POLYPS, ADENOMATOUS, HX OF 11/07/2009    Past Surgical History:  Procedure Laterality Date  . APPENDECTOMY    . COLONOSCOPY  08/02/2005     Rourk-Internal hemorrhoids, otherwise normal rectum. adenoma  . COLONOSCOPY  11/14/2011   RMR: Friable anal rectum-likely source of hematochezia Early sigmoid diverticular changes. Colonic tubular adenoma removed   . COLONOSCOPY N/A 05/17/2015   RMR: Internal hemorrhoids likely source of hematochezia in the setting of significant constipation. Colonic diverticulosis.   . Ileocolonoscopy  08/22/2008   Rourk- Friable anal canal, otherwise normal-appearing rectal mucosa, early shallow left-sided diverticula, colonic mucosa, and  terminal ileal mucosa appeared normal.   . REPLACEMENT TOTAL KNEE  2002    OB History    Gravida Para Term Preterm AB Living   3 2 1 1 1 2    SAB TAB Ectopic Multiple Live Births   1               Home Medications    Prior to Admission medications   Medication Sig Start Date End Date Taking? Authorizing Provider  amoxicillin (AMOXIL) 500 MG capsule Take 1 capsule (500 mg total) by mouth 3 (three) times daily. 07/07/16   Cuca Benassi Bunnie Pion, NP  ibuprofen (ADVIL,MOTRIN) 200 MG tablet Take 200 mg by mouth every 6 (six) hours as needed for mild pain or moderate pain.    Historical Provider, MD  lisinopril (PRINIVIL,ZESTRIL) 5 MG tablet Take 5 mg by mouth daily.     Historical Provider, MD  methocarbamol (ROBAXIN) 500 MG tablet Take 500 mg by mouth daily as needed for muscle spasms.    Historical Provider, MD  traMADol (ULTRAM) 50 MG tablet Take 1 tablet (50 mg  total) by mouth every 6 (six) hours as needed. 04/24/16   Tammy Triplett, PA-C    Family History Family History  Problem Relation Age of Onset  . Colon cancer Brother 26  . Lung cancer Brother   . Colon cancer Father 61  . Colon cancer Brother 35    Social History Social History  Substance Use Topics  . Smoking status: Never Smoker  . Smokeless tobacco: Never Used  . Alcohol use No     Allergies   Ketorolac tromethamine; Nitrofuran derivatives; Vicodin [hydrocodone-acetaminophen]; and  Latex   Review of Systems Review of Systems  Constitutional: Negative for fever.  HENT: Positive for sore throat. Negative for ear pain and rhinorrhea.   Eyes: Negative for visual disturbance.  Respiratory: Negative for shortness of breath.   Cardiovascular: Negative for chest pain.  Gastrointestinal: Negative for abdominal pain, diarrhea, nausea and vomiting.  All other systems reviewed and are negative.    Physical Exam Updated Vital Signs BP 143/85   Pulse 69   Temp 98.4 F (36.9 C) (Oral)   Resp 18   Ht 5\' 6"  (1.676 m)   Wt 95.3 kg   SpO2 97%   BMI 33.89 kg/m   Physical Exam  Constitutional: She is oriented to person, place, and time. She appears well-developed and well-nourished. No distress.  HENT:  Head: Normocephalic and atraumatic.  Right Ear: Tympanic membrane normal.  Left Ear: Tympanic membrane normal.  Nose: Nose normal.  Mouth/Throat: Uvula is midline and mucous membranes are normal. Oropharyngeal exudate and posterior oropharyngeal erythema present. Tonsils are 2+ on the right. Tonsils are 2+ on the left. Tonsillar exudate.  Tonsils have small areas of exudate bilaterally.   Eyes: EOM are normal. Pupils are equal, round, and reactive to light.  Neck: Normal range of motion.  Anterior cervical lymphadenopathy  Cardiovascular: Normal rate and regular rhythm.   Pulmonary/Chest: Effort normal.  Abdominal: Soft. She exhibits no distension. There is no tenderness.  Musculoskeletal: Normal range of motion.  Lymphadenopathy:    She has cervical adenopathy.  Neurological: She is alert and oriented to person, place, and time.  Skin: Skin is warm and dry. She is not diaphoretic.  Psychiatric: She has a normal mood and affect. Judgment normal.  Nursing note and vitals reviewed.  ED Treatments / Results  DIAGNOSTIC STUDIES: Oxygen Saturation is 100% on RA, normal by my interpretation.  COORDINATION OF CARE: 4:32 PM-Discussed treatment plan with pt at bedside  and pt agreed to plan.   Labs (all labs ordered are listed, but only abnormal results are displayed) Labs Reviewed  RAPID STREP SCREEN (NOT AT [facility])  CULTURE, GROUP A STREP [facility])   Radiology No results found.  Procedures Procedures (including critical care time)  Medications Ordered in ED Medications - No data to display   Initial Impression / Assessment and Plan / ED Course  I have reviewed the triage vital signs and the nursing notes.  Clinical Course   65 y.o. female with sore throat and tonsillar exudate stable for d/c without tonsillar abscess and no difficulty swallowing. Will treat with antibiotics and she will f/u with her PCP. She will return here for worsening symptoms. Return precautions discussed.  Final Clinical Impressions(s) / ED Diagnoses   Final diagnoses:  Tonsillitis    New Prescriptions Discharge Medication List as of 07/07/2016  4:36 PM    START taking these medications   Details  amoxicillin (AMOXIL) 500 MG capsule Take 1 capsule (500 mg total) by mouth  3 (three) times daily., Starting Sun 07/07/2016, Print      I personally performed the services described in this documentation, which was scribed in my presence. The recorded information has been reviewed and is accurate.    Index, NP 07/08/16 [id number]    Fredia Sorrow, MD 07/08/16 [phone]

## 2016-07-10 LAB — CULTURE, GROUP A STREP (THRC)

## 2016-10-10 ENCOUNTER — Encounter: Payer: Self-pay | Admitting: Internal Medicine

## 2016-12-25 ENCOUNTER — Encounter (HOSPITAL_COMMUNITY): Payer: Self-pay | Admitting: *Deleted

## 2016-12-25 ENCOUNTER — Emergency Department (HOSPITAL_COMMUNITY)
Admission: EM | Admit: 2016-12-25 | Discharge: 2016-12-25 | Disposition: A | Discharge status: Home / Self Care | Payer: Medicaid Other | Attending: Emergency Medicine | Admitting: Emergency Medicine

## 2016-12-25 DIAGNOSIS — I10 Essential (primary) hypertension: Secondary | ICD-10-CM | POA: Insufficient documentation

## 2016-12-25 DIAGNOSIS — Z91040 Latex allergy status: Secondary | ICD-10-CM | POA: Insufficient documentation

## 2016-12-25 DIAGNOSIS — H60501 Unspecified acute noninfective otitis externa, right ear: Secondary | ICD-10-CM | POA: Insufficient documentation

## 2016-12-25 DIAGNOSIS — H9201 Otalgia, right ear: Secondary | ICD-10-CM

## 2016-12-25 MED ORDER — CIPROFLOXACIN-DEXAMETHASONE 0.3-0.1 % OT SUSP: 4.0000 [drp] | Freq: Two times a day (BID) | OTIC | 0 refills | Status: DC

## 2016-12-25 NOTE — ED Provider Notes (Signed)
[facility] DEPT Provider Note   CSN: [id number] Arrival date & time: 12/25/16  2030     History   Chief Complaint Chief Complaint  Patient presents with  . Otalgia    HPI [name] [name] is a 66 y.o. female.  HPI  Patient presents to the ED for complaints of bilateral ear pain worse on the right side that began 2 days ago. No known trauma or injury to the area. She also reports some clear liquid discharge coming out of her right ear while she laid down. No previous history of these symptoms. She denies any hearing changes, tinnitus, bleeding from ear or sick contacts with similar symptoms. She has not tried anything for symptoms. She denies fever, rhinorrhea, cough, chest pain, trouble breathing, sinus pain, sore throat.  Past Medical History:  Diagnosis Date  . Adenomatous colon polyp 2007  . Chronic renal insufficiency   . DDD (degenerative disc disease), lumbar   . Depression   . Diverticulosis 2010  . Glucose intolerance (impaired glucose tolerance)   . Hyperlipidemia   . Hypertension   . Tubular adenoma of colon 11/2011    Patient Active Problem List   Diagnosis Date Noted  . Rectal bleeding 05/12/2015  . Tubular adenoma of colon 07/30/2012  . Vomiting 07/30/2012  . Family history of colon cancer 10/15/2010  . MIXED HYPERLIPIDEMIA 04/03/2010  . ESSENTIAL HYPERTENSION, BENIGN 04/03/2010  . CHEST PAIN 04/03/2010  . HEMORRHOIDS 11/07/2009  . CONSTIPATION, CHRONIC 11/07/2009  . HEMATOCHEZIA 11/07/2009  . COLONIC POLYPS, ADENOMATOUS, HX OF 11/07/2009    Past Surgical History:  Procedure Laterality Date  . APPENDECTOMY    . COLONOSCOPY  08/02/2005   Rourk-Internal hemorrhoids, otherwise normal rectum. adenoma  . COLONOSCOPY  11/14/2011   RMR: Friable anal rectum-likely source of hematochezia Early sigmoid diverticular changes. Colonic tubular adenoma removed   . COLONOSCOPY N/A 05/17/2015   RMR: Internal hemorrhoids likely source of hematochezia in the setting of  significant constipation. Colonic diverticulosis.   . Ileocolonoscopy  08/22/2008   Rourk- Friable anal canal, otherwise normal-appearing rectal mucosa, early shallow left-sided diverticula, colonic mucosa, and  terminal ileal mucosa appeared normal.   . REPLACEMENT TOTAL KNEE  2002    OB History    Gravida Para Term Preterm AB Living   3 2 1 1 1 2    SAB TAB Ectopic Multiple Live Births   1               Home Medications    Prior to Admission medications   Medication Sig Start Date End Date Taking? Authorizing Provider  amoxicillin (AMOXIL) 500 MG capsule Take 1 capsule (500 mg total) by mouth 3 (three) times daily. 07/07/16   Ashley Murrain, NP  ciprofloxacin-dexamethasone (CIPRODEX) OTIC suspension Place 4 drops into the right ear 2 (two) times daily. 12/25/16   Dmetrius Ambs, PA-C  ibuprofen (ADVIL,MOTRIN) 200 MG tablet Take 200 mg by mouth every 6 (six) hours as needed for mild pain or moderate pain.    [provider]  lisinopril (PRINIVIL,ZESTRIL) 5 MG tablet Take 5 mg by mouth daily.     [provider]  methocarbamol (ROBAXIN) 500 MG tablet Take 500 mg by mouth daily as needed for muscle spasms.    [provider]  traMADol (ULTRAM) 50 MG tablet Take 1 tablet (50 mg total) by mouth every 6 (six) hours as needed. 04/24/16   Kem Parkinson, PA-C    Family History Family History  Problem Relation Age of  Onset  . Colon cancer Brother 53  . Lung cancer Brother   . Colon cancer Father 47  . Colon cancer Brother 6    Social History Social History  Substance Use Topics  . Smoking status: Never Smoker  . Smokeless tobacco: Never Used  . Alcohol use No     Allergies   Ketorolac tromethamine; Nitrofuran derivatives; Vicodin [hydrocodone-acetaminophen]; and Latex   Review of Systems Review of Systems  Constitutional: Negative for appetite change, chills and fever.  HENT: Positive for ear discharge and ear pain. Negative for dental problem,  rhinorrhea, sinus pain, sore throat and trouble swallowing.   Respiratory: Negative for shortness of breath.   Cardiovascular: Negative for chest pain.  Gastrointestinal: Negative for nausea and vomiting.  Musculoskeletal: Negative for myalgias.  Skin: Negative for color change, rash and wound.     Physical Exam Updated Vital Signs BP (!) 160/87   Pulse 60   Temp 97.8 F (36.6 C) (Oral)   Resp 20   Ht 5\' 6"  (1.676 m)   Wt 98 kg (216 lb)   SpO2 98%   BMI 34.86 kg/m   Physical Exam  Constitutional: She appears well-developed and well-nourished. No distress.  HENT:  Head: Normocephalic and atraumatic.  Right Ear: External ear normal. There is drainage. Tympanic membrane is scarred and erythematous. Tympanic membrane is not injected, not perforated, not retracted and not bulging. A middle ear effusion is present.  Left Ear: External ear normal. Tympanic membrane is not injected, not scarred, not perforated, not erythematous, not retracted and not bulging.  No middle ear effusion.  Nose: No mucosal edema or rhinorrhea.  Mouth/Throat: Uvula is midline and oropharynx is clear and moist.  Pain with palpation of the tragus on right ear. There is dried drainage present in the right ear. No visible perforation or bloody drainage noted. No mastoid tenderness.  Eyes: Conjunctivae and EOM are normal. No scleral icterus.  Neck: Normal range of motion.  Pulmonary/Chest: Effort normal. No respiratory distress.  Neurological: She is alert.  Skin: No rash noted. She is not diaphoretic.  Psychiatric: She has a normal mood and affect.  Nursing note and vitals reviewed.    ED Treatments / Results  Labs (all labs ordered are listed, but only abnormal results are displayed) Labs Reviewed - No data to display  EKG  EKG Interpretation None       Radiology No results found.  Procedures Procedures (including critical care time)  Medications Ordered in ED Medications - No data to  display   Initial Impression / Assessment and Plan / ED Course  I have reviewed the triage vital signs and the nursing notes.  Pertinent labs & imaging results that were available during my care of the patient were reviewed by me and considered in my medical decision making (see chart for details).     Patient presents to ED for bilateral ear pain worse on the right side. She reports history of clear liquid drainage from the ear when laying down. She denies any previous symptoms similar to this. She denies fever, tinnitus, hearing changes, injury or sick contacts. She is afebrile with no history of fever. On physical exam there is no perforation present but there is a scarred and erythematous TM. There is dried drainage present in the ear. No blood noted. External ear appears normal. She does have pain with palpation of the tragus on the right side. Remainder of HENT exam appears normal and no complaints of other URI  symptoms. Will treat for otitis externa due to tragus pain with palpation and drainage present. We'll give Ciprodex and advised to follow-up with PCP for further evaluation. Patient appears stable for discharge at this time. Strict return precautions given.  Final Clinical Impressions(s) / ED Diagnoses   Final diagnoses:  Right ear pain  Acute otitis externa of right ear, unspecified type    New Prescriptions New Prescriptions   CIPROFLOXACIN-DEXAMETHASONE (CIPRODEX) OTIC SUSPENSION    Place 4 drops into the right ear 2 (two) times daily.     Delia Heady, PA-C 12/25/16 2123    Julianne Rice, MD 12/31/16 [phone]

## 2016-12-25 NOTE — ED Triage Notes (Signed)
Pt c/o bilateral ear pain, worse on the right that started two days ago,

## 2016-12-25 NOTE — Discharge Instructions (Signed)
Used eardrops twice daily as directed. Tylenol as needed for pain. Follow-up with PCP for further evaluation. Return to ED for worsening pain, increased drainage, hearing changes, ringing in ears, fever.

## 2017-01-31 ENCOUNTER — Emergency Department (HOSPITAL_COMMUNITY): Payer: Medicaid Other

## 2017-01-31 ENCOUNTER — Encounter (HOSPITAL_COMMUNITY): Payer: Self-pay

## 2017-01-31 ENCOUNTER — Emergency Department (HOSPITAL_COMMUNITY)
Admission: EM | Admit: 2017-01-31 | Discharge: 2017-01-31 | Disposition: A | Discharge status: Home / Self Care | Payer: Medicaid Other | Attending: Emergency Medicine | Admitting: Emergency Medicine

## 2017-01-31 DIAGNOSIS — R1030 Lower abdominal pain, unspecified: Secondary | ICD-10-CM

## 2017-01-31 DIAGNOSIS — N390 Urinary tract infection, site not specified: Secondary | ICD-10-CM | POA: Diagnosis not present

## 2017-01-31 DIAGNOSIS — Z91040 Latex allergy status: Secondary | ICD-10-CM | POA: Insufficient documentation

## 2017-01-31 DIAGNOSIS — K5900 Constipation, unspecified: Secondary | ICD-10-CM | POA: Diagnosis not present

## 2017-01-31 DIAGNOSIS — Z79899 Other long term (current) drug therapy: Secondary | ICD-10-CM | POA: Diagnosis not present

## 2017-01-31 DIAGNOSIS — I129 Hypertensive chronic kidney disease with stage 1 through stage 4 chronic kidney disease, or unspecified chronic kidney disease: Secondary | ICD-10-CM | POA: Diagnosis not present

## 2017-01-31 DIAGNOSIS — K5909 Other constipation: Secondary | ICD-10-CM

## 2017-01-31 DIAGNOSIS — N189 Chronic kidney disease, unspecified: Secondary | ICD-10-CM | POA: Insufficient documentation

## 2017-01-31 DIAGNOSIS — Z96659 Presence of unspecified artificial knee joint: Secondary | ICD-10-CM | POA: Insufficient documentation

## 2017-01-31 DIAGNOSIS — R300 Dysuria: Secondary | ICD-10-CM | POA: Diagnosis present

## 2017-01-31 LAB — CBC WITH DIFFERENTIAL/PLATELET
BASOS PCT: 0 %
Basophils Absolute: 0 10*3/uL (ref 0.0–0.1)
EOS ABS: 0.2 10*3/uL (ref 0.0–0.7)
EOS PCT: 4 %
HCT: 39.3 % (ref 36.0–46.0)
Hemoglobin: 12.8 g/dL (ref 12.0–15.0)
Lymphocytes Relative: 35 %
Lymphs Abs: 2 10*3/uL (ref 0.7–4.0)
MCH: 30.5 pg (ref 26.0–34.0)
MCHC: 32.6 g/dL (ref 30.0–36.0)
MCV: 93.8 fL (ref 78.0–100.0)
Monocytes Absolute: 0.3 10*3/uL (ref 0.1–1.0)
Monocytes Relative: 6 %
Neutro Abs: 3.1 10*3/uL (ref 1.7–7.7)
Neutrophils Relative %: 55 %
PLATELETS: 186 10*3/uL (ref 150–400)
RBC: 4.19 MIL/uL (ref 3.87–5.11)
RDW: 14.1 % (ref 11.5–15.5)
WBC: 5.7 10*3/uL (ref 4.0–10.5)

## 2017-01-31 LAB — URINALYSIS, ROUTINE W REFLEX MICROSCOPIC
BILIRUBIN URINE: NEGATIVE
Glucose, UA: NEGATIVE mg/dL
HGB URINE DIPSTICK: NEGATIVE
Ketones, ur: NEGATIVE mg/dL
Leukocytes, UA: NEGATIVE
Nitrite: NEGATIVE
PROTEIN: NEGATIVE mg/dL
Specific Gravity, Urine: 1.02 (ref 1.005–1.030)
pH: 5.5 (ref 5.0–8.0)

## 2017-01-31 LAB — BASIC METABOLIC PANEL
Anion gap: 6 (ref 5–15)
BUN: 19 mg/dL (ref 6–20)
CO2: 26 mmol/L (ref 22–32)
CREATININE: 1 mg/dL (ref 0.44–1.00)
Calcium: 9.2 mg/dL (ref 8.9–10.3)
Chloride: 106 mmol/L (ref 101–111)
GFR calc Af Amer: >60 mL/min (ref >60)
GFR, EST NON AFRICAN AMERICAN: 58 mL/min — AB (ref >60)
Glucose, Bld: 103 mg/dL — ABNORMAL HIGH (ref 65–99)
POTASSIUM: 4.2 mmol/L (ref 3.5–5.1)
SODIUM: 138 mmol/L (ref 135–145)

## 2017-01-31 MED ORDER — DOCUSATE SODIUM 100 MG PO CAPS: 100.0000 mg | ORAL_CAPSULE | Freq: Two times a day (BID) | ORAL | 0 refills | Status: DC

## 2017-01-31 MED ORDER — POLYETHYLENE GLYCOL 3350 17 G PO PACK: 17.0000 g | PACK | Freq: Every day | ORAL | 0 refills | Status: DC

## 2017-01-31 MED ORDER — ONDANSETRON HCL 4 MG/2ML IJ SOLN
4.0000 mg | Freq: Once | INTRAMUSCULAR | Status: AC
  Administered 2017-01-31: 4 mg via INTRAVENOUS
  Filled 2017-01-31: qty 2

## 2017-01-31 MED ORDER — SODIUM CHLORIDE 0.9 % IV BOLUS (SEPSIS)
1000.0000 mL | Freq: Once | INTRAVENOUS | Status: AC
  Administered 2017-01-31: 1000 mL via INTRAVENOUS

## 2017-01-31 MED ORDER — PHENAZOPYRIDINE HCL 100 MG PO TABS
100.0000 mg | ORAL_TABLET | Freq: Once | ORAL | Status: AC
  Administered 2017-01-31: 100 mg via ORAL
  Filled 2017-01-31: qty 1

## 2017-01-31 NOTE — ED Notes (Signed)
Patient transported to CT 

## 2017-01-31 NOTE — Discharge Instructions (Signed)
You were seen today for lower abdominal pain. Continue antibiotics for your urinary tract infection. Your CT scan shows some stool burden mostly in the lower colon and rectum. Take MiraLAX once to twice daily and Colace. Increase fiber in your diet. If you have any new or worsening symptoms you should be reevaluated.

## 2017-01-31 NOTE — ED Provider Notes (Signed)
[facility] DEPT Provider Note   CSN: [id number] Arrival date & time: 01/31/17  0445     History   Chief Complaint Chief Complaint  Patient presents with  . Urinary Tract Infection    HPI [name] [name] is a 66 y.o. female.  HPI  This is a 66 year old female with a history of chronic renal insufficiency, diverticulosis, hypertension, hyperlipidemia who presents with dysuria and back pain. Patient reports that she was diagnosed with UTI on Wednesday. She was started on antibiotic. She reports persistent worsening cramping lower abdominal pain that radiates to the bilateral back and flank. She reports hematuria and dysuria. Denies fevers. Current pain is 10 out of 10.  Past Medical History:  Diagnosis Date  . Adenomatous colon polyp 2007  . Chronic renal insufficiency   . DDD (degenerative disc disease), lumbar   . Depression   . Diverticulosis 2010  . Glucose intolerance (impaired glucose tolerance)   . Hyperlipidemia   . Hypertension   . Tubular adenoma of colon 11/2011    Patient Active Problem List   Diagnosis Date Noted  . Rectal bleeding 05/12/2015  . Tubular adenoma of colon 07/30/2012  . Vomiting 07/30/2012  . Family history of colon cancer 10/15/2010  . MIXED HYPERLIPIDEMIA 04/03/2010  . ESSENTIAL HYPERTENSION, BENIGN 04/03/2010  . CHEST PAIN 04/03/2010  . HEMORRHOIDS 11/07/2009  . CONSTIPATION, CHRONIC 11/07/2009  . HEMATOCHEZIA 11/07/2009  . COLONIC POLYPS, ADENOMATOUS, HX OF 11/07/2009    Past Surgical History:  Procedure Laterality Date  . APPENDECTOMY    . COLONOSCOPY  08/02/2005   Rourk-Internal hemorrhoids, otherwise normal rectum. adenoma  . COLONOSCOPY  11/14/2011   RMR: Friable anal rectum-likely source of hematochezia Early sigmoid diverticular changes. Colonic tubular adenoma removed   . COLONOSCOPY N/A 05/17/2015   RMR: Internal hemorrhoids likely source of hematochezia in the setting of significant constipation. Colonic diverticulosis.   .  Ileocolonoscopy  08/22/2008   Rourk- Friable anal canal, otherwise normal-appearing rectal mucosa, early shallow left-sided diverticula, colonic mucosa, and  terminal ileal mucosa appeared normal.   . REPLACEMENT TOTAL KNEE  2002    OB History    Gravida Para Term Preterm AB Living   3 2 1 1 1 2    SAB TAB Ectopic Multiple Live Births   1               Home Medications    Prior to Admission medications   Medication Sig Start Date End Date Taking? Authorizing Provider  amoxicillin (AMOXIL) 500 MG capsule Take 1 capsule (500 mg total) by mouth 3 (three) times daily. 07/07/16   Ashley Murrain, NP  ciprofloxacin-dexamethasone (CIPRODEX) OTIC suspension Place 4 drops into the right ear 2 (two) times daily. 12/25/16   Khatri, Hina, PA-C  docusate sodium (COLACE) 100 MG capsule Take 1 capsule (100 mg total) by mouth every 12 (twelve) hours. 01/31/17   Almon Whitford, Barbette Hair, MD  ibuprofen (ADVIL,MOTRIN) 200 MG tablet Take 200 mg by mouth every 6 (six) hours as needed for mild pain or moderate pain.    [provider]  lisinopril (PRINIVIL,ZESTRIL) 5 MG tablet Take 5 mg by mouth daily.     [provider]  methocarbamol (ROBAXIN) 500 MG tablet Take 500 mg by mouth daily as needed for muscle spasms.    [provider]  polyethylene glycol (MIRALAX) packet Take 17 g by mouth daily. 01/31/17   Khrystina Bonnes, Barbette Hair, MD  traMADol (ULTRAM) 50 MG tablet Take 1 tablet (50 mg total)  by mouth every 6 (six) hours as needed. 04/24/16   Kem Parkinson, PA-C    Family History Family History  Problem Relation Age of Onset  . Colon cancer Brother 51  . Lung cancer Brother   . Colon cancer Father 31  . Colon cancer Brother 3    Social History Social History  Substance Use Topics  . Smoking status: Never Smoker  . Smokeless tobacco: Never Used  . Alcohol use No     Allergies   Ketorolac tromethamine; Nitrofuran derivatives; Vicodin [hydrocodone-acetaminophen]; and  Latex   Review of Systems Review of Systems  Constitutional: Negative for fever.  Respiratory: Negative for shortness of breath.   Cardiovascular: Negative for chest pain.  Gastrointestinal: Positive for nausea. Negative for vomiting.  Genitourinary: Positive for dysuria, flank pain, hematuria and pelvic pain.  All other systems reviewed and are negative.    Physical Exam Updated Vital Signs BP 133/77   Pulse (!) 44   Temp 98.3 F (36.8 C) (Oral)   Resp 17   Ht 5\' 6"  (1.676 m)   Wt 93.4 kg (206 lb)   SpO2 100%   BMI 33.25 kg/m   Physical Exam  Constitutional: She is oriented to person, place, and time. She appears well-developed and well-nourished. No distress.  HENT:  Head: Normocephalic and atraumatic.  Cardiovascular: Normal rate, regular rhythm and normal heart sounds.   No murmur heard. Pulmonary/Chest: Effort normal and breath sounds normal. No respiratory distress. She has no wheezes.  Abdominal: Soft. Bowel sounds are normal. There is tenderness. There is no rebound and no guarding.  Mild suprapubic tenderness to palpation without rebound or guarding  Genitourinary:  Genitourinary Comments: No CVA tenderness  Neurological: She is alert and oriented to person, place, and time.  Skin: Skin is warm and dry.  Psychiatric: She has a normal mood and affect.  Nursing note and vitals reviewed.    ED Treatments / Results  Labs (all labs ordered are listed, but only abnormal results are displayed) Labs Reviewed  BASIC METABOLIC PANEL - Abnormal; Notable for the following:       Result Value   Glucose, Bld 103 (*)    GFR calc non Af Amer 58 (*)    All other components within normal limits  URINE CULTURE  CBC WITH DIFFERENTIAL/PLATELET  URINALYSIS, ROUTINE W REFLEX MICROSCOPIC    EKG  EKG Interpretation None       Radiology Ct Renal Stone Study  Result Date: 01/31/2017 CLINICAL DATA:  Bilateral flank pain and hematuria. EXAM: CT ABDOMEN AND PELVIS  WITHOUT CONTRAST TECHNIQUE: Multidetector CT imaging of the abdomen and pelvis was performed following the standard protocol without IV contrast. COMPARISON:  CT abdomen and pelvis dated April 23, 2016. FINDINGS: Lower chest: No acute abnormality. 3 mm peripheral right lower lobe lung nodule, stable since 2006. No pleural effusion. Hepatobiliary: No focal liver abnormality is seen. No gallstones, gallbladder wall thickening, or biliary dilatation. Pancreas: Unremarkable. No pancreatic ductal dilatation or surrounding inflammatory changes. Spleen: Normal in size without focal abnormality. Adrenals/Urinary Tract: Adrenal glands are unremarkable. Kidneys are normal, without renal calculi, focal lesion, or hydronephrosis. Bladder is unremarkable. Stomach/Bowel: Stomach is within normal limits. Appendix is surgically absent. Scattered left-sided colonic diverticula. No evidence of bowel wall thickening, distention, or inflammatory changes. Vascular/Lymphatic: Aortic atherosclerosis. No enlarged abdominal or pelvic lymph nodes. Reproductive: Status post hysterectomy. No adnexal masses. Other: No abdominal wall hernia or abnormality. No abdominopelvic ascites. Musculoskeletal: No acute or significant osseous findings. Degenerative  changes of the lower lumbar spine, severe at L3-L4 and L5-S1. IMPRESSION: 1. No evidence of obstructive uropathy. No renal calculi. No hydronephrosis. 2. No acute intra-abdominal process. Electronically Signed   By: Titus Dubin M.D.   On: 01/31/2017 07:20    Procedures Procedures (including critical care time)  Medications Ordered in ED Medications  phenazopyridine (PYRIDIUM) tablet 100 mg (100 mg Oral Given 01/31/17 0527)  sodium chloride 0.9 % bolus 1,000 mL (0 mLs Intravenous Stopped 01/31/17 0731)  ondansetron (ZOFRAN) injection 4 mg (4 mg Intravenous Given 01/31/17 0540)     Initial Impression / Assessment and Plan / ED Course  I have reviewed the triage vital signs and the  nursing notes.  Pertinent labs & imaging results that were available during my care of the patient were reviewed by me and considered in my medical decision making (see chart for details).     Patient presents with lower abdominal pain. Recent diagnosis of urinary tract infection. She reports persistent dysuria. Abdominal pain is bilateral as is her flank pain. She is nontoxic. Afebrile. Basic labwork obtained and largely reassuring. No leukocytosis. Urinalysis is clean. Urine culture was sent. CT scan was obtained to evaluate for kidney stones. CT scan is largely reassuring and while not commented on in the radiology report, I have reviewed the scans and there appears to be a fairly significant amount of stool in the lower colon and rectum. On repeat evaluation, patient states that she has not had a bowel movement in several days. She reports history of constipation. She does not take anything daily. She states that she feels much better after some nausea medication. Recommend continued antibiotics for UTI and addition of MiraLAX and Colace. I have offered impaction. Patient declines.  After history, exam, and medical workup I feel the patient has been appropriately medically screened and is safe for discharge home. Pertinent diagnoses were discussed with the patient. Patient was given return precautions.   Final Clinical Impressions(s) / ED Diagnoses   Final diagnoses:  Lower abdominal pain  Other constipation  Lower urinary tract infectious disease    New Prescriptions New Prescriptions   DOCUSATE SODIUM (COLACE) 100 MG CAPSULE    Take 1 capsule (100 mg total) by mouth every 12 (twelve) hours.   POLYETHYLENE GLYCOL (MIRALAX) PACKET    Take 17 g by mouth daily.     Merryl Hacker, MD 01/31/17 [phone]

## 2017-01-31 NOTE — ED Triage Notes (Signed)
Pt reports being diagnosed with a UTI Wednesday and started antibiotic, but does not know name of medication. Reports she has taken as directed. Pt now reports continued  lower abdomen pain, dysuria, hematuria, and now has bilateral flank/lower back pain that radiates to lower abdomen.

## 2017-02-01 ENCOUNTER — Encounter (HOSPITAL_COMMUNITY): Payer: Self-pay | Admitting: Emergency Medicine

## 2017-02-01 ENCOUNTER — Emergency Department (HOSPITAL_COMMUNITY)
Admission: EM | Admit: 2017-02-01 | Discharge: 2017-02-01 | Disposition: A | Discharge status: Home / Self Care | Payer: Medicaid Other | Attending: Emergency Medicine | Admitting: Emergency Medicine

## 2017-02-01 DIAGNOSIS — M545 Low back pain, unspecified: Secondary | ICD-10-CM

## 2017-02-01 DIAGNOSIS — I10 Essential (primary) hypertension: Secondary | ICD-10-CM | POA: Insufficient documentation

## 2017-02-01 DIAGNOSIS — K625 Hemorrhage of anus and rectum: Secondary | ICD-10-CM | POA: Insufficient documentation

## 2017-02-01 DIAGNOSIS — G8929 Other chronic pain: Secondary | ICD-10-CM

## 2017-02-01 LAB — URINE CULTURE: CULTURE: NO GROWTH

## 2017-02-01 MED ORDER — HYDROMORPHONE HCL 1 MG/ML IJ SOLN
0.5000 mg | Freq: Once | INTRAMUSCULAR | Status: DC
  Filled 2017-02-01: qty 1

## 2017-02-01 MED ORDER — HYDROCORTISONE ACETATE 25 MG RE SUPP: 25.0000 mg | Freq: Two times a day (BID) | RECTAL | 0 refills | Status: DC

## 2017-02-01 MED ORDER — OXYCODONE-ACETAMINOPHEN 5-325 MG PO TABS: 0.5000 | ORAL_TABLET | Freq: Three times a day (TID) | ORAL | 0 refills | Status: DC | PRN

## 2017-02-01 MED ORDER — HYDROCORTISONE ACETATE 25 MG RE SUPP
25.0000 mg | Freq: Two times a day (BID) | RECTAL | Status: DC
  Administered 2017-02-01: 25 mg via RECTAL
  Filled 2017-02-01: qty 1

## 2017-02-01 NOTE — ED Notes (Signed)
Pt states she has been passing soft stool today and has seen some bright red blood when she is wiping. Denies frank blood in stool. Was told she was impacted last night and refused disimpaction. Hx of hemorrhoids.

## 2017-02-01 NOTE — ED Provider Notes (Signed)
[facility] DEPT Provider Note   CSN: [id number] Arrival date & time: 02/01/17  1543     History   Chief Complaint Chief Complaint  Patient presents with  . Back Pain  . Rectal Bleeding    HPI [name] [name] is a 66 y.o. female.  66 year old female presents here with some light tinged stool. She had multiple episodes of diarrhea today after being sent home with constipation last night secondary to her taking stool softeners and fiber. Lescol bowel movements have been blood streaked. No blood in the stool. No large clots or large amount of blood. She states that she also has some blood on the toilet paper after she wipes. She denies this before. Last colonoscopy was a few years ago and found to have had diverticulosis. No lightheadedness, chest pain, shortness of breath or other associated or modifying symptoms.        Past Medical History:  Diagnosis Date  . Adenomatous colon polyp 2007  . Chronic renal insufficiency   . DDD (degenerative disc disease), lumbar   . Depression   . Diverticulosis 2010  . Glucose intolerance (impaired glucose tolerance)   . Hyperlipidemia   . Hypertension   . Tubular adenoma of colon 11/2011    Patient Active Problem List   Diagnosis Date Noted  . Rectal bleeding 05/12/2015  . Tubular adenoma of colon 07/30/2012  . Vomiting 07/30/2012  . Family history of colon cancer 10/15/2010  . MIXED HYPERLIPIDEMIA 04/03/2010  . ESSENTIAL HYPERTENSION, BENIGN 04/03/2010  . CHEST PAIN 04/03/2010  . HEMORRHOIDS 11/07/2009  . CONSTIPATION, CHRONIC 11/07/2009  . HEMATOCHEZIA 11/07/2009  . COLONIC POLYPS, ADENOMATOUS, HX OF 11/07/2009    Past Surgical History:  Procedure Laterality Date  . APPENDECTOMY    . COLONOSCOPY  08/02/2005   Rourk-Internal hemorrhoids, otherwise normal rectum. adenoma  . COLONOSCOPY  11/14/2011   RMR: Friable anal rectum-likely source of hematochezia Early sigmoid diverticular changes. Colonic tubular adenoma removed   .  COLONOSCOPY N/A 05/17/2015   RMR: Internal hemorrhoids likely source of hematochezia in the setting of significant constipation. Colonic diverticulosis.   . Ileocolonoscopy  08/22/2008   Rourk- Friable anal canal, otherwise normal-appearing rectal mucosa, early shallow left-sided diverticula, colonic mucosa, and  terminal ileal mucosa appeared normal.   . REPLACEMENT TOTAL KNEE  2002    OB History    Gravida Para Term Preterm AB Living   3 2 1 1 1 2    SAB TAB Ectopic Multiple Live Births   1               Home Medications    Prior to Admission medications   Medication Sig Start Date End Date Taking? Authorizing Provider  acetaminophen (TYLENOL) 500 MG tablet Take 1,000 mg by mouth every 6 (six) hours as needed for mild pain, moderate pain or fever.   Yes [provider]  ciprofloxacin (CIPRO) 500 MG tablet Take 500 mg by mouth 2 (two) times daily.   Yes [provider]  ciprofloxacin-dexamethasone (CIPRODEX) OTIC suspension Place 4 drops into the right ear 2 (two) times daily. 12/25/16  Yes Khatri, Hina, PA-C  docusate sodium (COLACE) 100 MG capsule Take 1 capsule (100 mg total) by mouth every 12 (twelve) hours. Patient taking differently: Take 100-200 mg by mouth every 12 (twelve) hours.  01/31/17  Yes Horton, Barbette Hair, MD  lisinopril (PRINIVIL,ZESTRIL) 20 MG tablet Take 20 mg by mouth daily.    Yes [provider]  Vitamin D, Ergocalciferol, (DRISDOL) 50000  units CAPS capsule Take 50,000 Units by mouth every 30 (thirty) days.   Yes [provider]  polyethylene glycol (MIRALAX) packet Take 17 g by mouth daily. Patient not taking: Reported on 02/01/2017 01/31/17   Horton, Barbette Hair, MD    Family History Family History  Problem Relation Age of Onset  . Colon cancer Brother 10  . Lung cancer Brother   . Colon cancer Father 66  . Colon cancer Brother 67    Social History Social History  Substance Use Topics  . Smoking status: Never Smoker  .  Smokeless tobacco: Never Used  . Alcohol use No     Allergies   Ketorolac tromethamine; Ivp dye [iodinated diagnostic agents]; Nitrofuran derivatives; Vicodin [hydrocodone-acetaminophen]; and Latex   Review of Systems Review of Systems  All other systems reviewed and are negative.    Physical Exam Updated Vital Signs BP (!) 152/92   Pulse (!) 51   Temp 98.2 F (36.8 C) (Oral)   Resp 16   Ht 5\' 6"  (1.676 m)   Wt 93.4 kg (206 lb)   SpO2 100%   BMI 33.25 kg/m   Physical Exam  Constitutional: She appears well-developed and well-nourished.  HENT:  Head: Normocephalic and atraumatic.  Eyes: Conjunctivae and EOM are normal.  Neck: Normal range of motion.  Cardiovascular: Normal rate and regular rhythm.   Pulmonary/Chest: No stridor. No respiratory distress.  Abdominal: Soft. She exhibits no distension.  Genitourinary: Rectal exam shows internal hemorrhoid. Rectal exam shows no external hemorrhoid, no fissure and no tenderness.  Musculoskeletal: Normal range of motion. She exhibits no edema or deformity.  Neurological: She is alert.  Skin: Skin is warm and dry.  Nursing note and vitals reviewed.    ED Treatments / Results  Labs (all labs ordered are listed, but only abnormal results are displayed) Labs Reviewed - No data to display  EKG  EKG Interpretation None       Radiology Ct Renal Stone Study  Result Date: 01/31/2017 CLINICAL DATA:  Bilateral flank pain and hematuria. EXAM: CT ABDOMEN AND PELVIS WITHOUT CONTRAST TECHNIQUE: Multidetector CT imaging of the abdomen and pelvis was performed following the standard protocol without IV contrast. COMPARISON:  CT abdomen and pelvis dated April 23, 2016. FINDINGS: Lower chest: No acute abnormality. 3 mm peripheral right lower lobe lung nodule, stable since 2006. No pleural effusion. Hepatobiliary: No focal liver abnormality is seen. No gallstones, gallbladder wall thickening, or biliary dilatation. Pancreas:  Unremarkable. No pancreatic ductal dilatation or surrounding inflammatory changes. Spleen: Normal in size without focal abnormality. Adrenals/Urinary Tract: Adrenal glands are unremarkable. Kidneys are normal, without renal calculi, focal lesion, or hydronephrosis. Bladder is unremarkable. Stomach/Bowel: Stomach is within normal limits. Appendix is surgically absent. Scattered left-sided colonic diverticula. No evidence of bowel wall thickening, distention, or inflammatory changes. Vascular/Lymphatic: Aortic atherosclerosis. No enlarged abdominal or pelvic lymph nodes. Reproductive: Status post hysterectomy. No adnexal masses. Other: No abdominal wall hernia or abnormality. No abdominopelvic ascites. Musculoskeletal: No acute or significant osseous findings. Degenerative changes of the lower lumbar spine, severe at L3-L4 and L5-S1. IMPRESSION: 1. No evidence of obstructive uropathy. No renal calculi. No hydronephrosis. 2. No acute intra-abdominal process. Electronically Signed   By: Titus Dubin M.D.   On: 01/31/2017 07:20    Procedures Procedures (including critical care time)  Medications Ordered in ED Medications  HYDROmorphone (DILAUDID) injection 0.5 mg (0.5 mg Intramuscular Refused 02/01/17 1915)  hydrocortisone (ANUSOL-HC) suppository 25 mg (25 mg Rectal Given 02/01/17 1912)  Initial Impression / Assessment and Plan / ED Course  I have reviewed the triage vital signs and the nursing notes.  Pertinent labs & imaging results that were available during my care of the patient were reviewed by me and considered in my medical decision making (see chart for details).     Suspect likely anal fissure versus bleeding internal hemorrhoid. Doubt this is actual rectal bleeding. Rectal exam showed an empty vaultwas able to be collected. She is not anemic and stable to think that she has a massive GI bleed. She has severe relief with a suppository here. Plan to discharge on the same. Pain medicine  given for her back pain which seems to be worsening as well. No indication for repeat imaging after CT scan done yesterday with the same symptoms.  Final Clinical Impressions(s) / ED Diagnoses   Final diagnoses:  None    New Prescriptions New Prescriptions   No medications on file     Catlin Doria, Corene Cornea, MD 02/01/17 2152

## 2017-02-01 NOTE — ED Triage Notes (Signed)
Pt states she continues to feel bad after her visit yesterday with continued back pain.  States she was dx with fecal impaction yesterday and has used the bathroom so much she is now having rectal bleeding.  Bright red blood when wiping.

## 2017-02-01 NOTE — ED Notes (Signed)
Pt states she does not want pain medication because she is driving herself home.

## 2017-02-04 ENCOUNTER — Encounter (HOSPITAL_COMMUNITY): Payer: Self-pay | Admitting: Emergency Medicine

## 2017-02-04 ENCOUNTER — Emergency Department (HOSPITAL_COMMUNITY)
Admission: EM | Admit: 2017-02-04 | Discharge: 2017-02-04 | Disposition: A | Discharge status: Home / Self Care | Payer: Medicaid Other | Attending: Emergency Medicine | Admitting: Emergency Medicine

## 2017-02-04 DIAGNOSIS — I10 Essential (primary) hypertension: Secondary | ICD-10-CM | POA: Diagnosis not present

## 2017-02-04 DIAGNOSIS — Z79899 Other long term (current) drug therapy: Secondary | ICD-10-CM | POA: Insufficient documentation

## 2017-02-04 DIAGNOSIS — M545 Low back pain, unspecified: Secondary | ICD-10-CM

## 2017-02-04 DIAGNOSIS — Z91040 Latex allergy status: Secondary | ICD-10-CM | POA: Diagnosis not present

## 2017-02-04 MED ORDER — METHOCARBAMOL 500 MG PO TABS: 500.0000 mg | ORAL_TABLET | Freq: Three times a day (TID) | ORAL | 0 refills | Status: DC | PRN

## 2017-02-04 MED ORDER — OXYCODONE-ACETAMINOPHEN 5-325 MG PO TABS
2.0000 | ORAL_TABLET | Freq: Once | ORAL | Status: AC
  Administered 2017-02-04: 2 via ORAL
  Filled 2017-02-04: qty 2

## 2017-02-04 MED ORDER — METHOCARBAMOL 500 MG PO TABS
500.0000 mg | ORAL_TABLET | Freq: Once | ORAL | Status: AC
  Administered 2017-02-04: 500 mg via ORAL
  Filled 2017-02-04: qty 1

## 2017-02-04 MED ORDER — IBUPROFEN 400 MG PO TABS
600.0000 mg | ORAL_TABLET | Freq: Once | ORAL | Status: AC
  Administered 2017-02-04: 600 mg via ORAL
  Filled 2017-02-04: qty 2

## 2017-02-04 NOTE — ED Triage Notes (Signed)
Pt tested one week ago for UTI and put on an abx. Seen here 3 days ago for same, right side lower back pain radiating around. Seen urgent care today and sent here for ? UTI. Pain worse with movement. Pt makes a lot of beds. Denies any change in urination

## 2017-02-04 NOTE — ED Provider Notes (Signed)
[facility] DEPT Provider Note   CSN: [id number] Arrival date & time: 02/04/17  1151     History   Chief Complaint Chief Complaint  Patient presents with  . Back Pain    HPI [name] [name] is a 66 y.o. female.  HPI Patient reports low back pain over the past 7-10 days.  Recently seen and evaluated for back pain and treated for possible urinary tract infection but urine culture grew out no organisms.  CT scan 4 days ago demonstrated no ureteral stones or other intra-abdominal pathology.  Patient ports low back pain is worse with movement and palpation of her low back.  No new rash.  No fevers or chills.  Denies weakness in her legs.  No paresthesias.  No recent injury or trauma.   Past Medical History:  Diagnosis Date  . Adenomatous colon polyp 2007  . Chronic renal insufficiency   . DDD (degenerative disc disease), lumbar   . Depression   . Diverticulosis 2010  . Glucose intolerance (impaired glucose tolerance)   . Hyperlipidemia   . Hypertension   . Tubular adenoma of colon 11/2011    Patient Active Problem List   Diagnosis Date Noted  . Rectal bleeding 05/12/2015  . Tubular adenoma of colon 07/30/2012  . Vomiting 07/30/2012  . Family history of colon cancer 10/15/2010  . MIXED HYPERLIPIDEMIA 04/03/2010  . ESSENTIAL HYPERTENSION, BENIGN 04/03/2010  . CHEST PAIN 04/03/2010  . HEMORRHOIDS 11/07/2009  . CONSTIPATION, CHRONIC 11/07/2009  . HEMATOCHEZIA 11/07/2009  . COLONIC POLYPS, ADENOMATOUS, HX OF 11/07/2009    Past Surgical History:  Procedure Laterality Date  . APPENDECTOMY    . COLONOSCOPY  08/02/2005   Rourk-Internal hemorrhoids, otherwise normal rectum. adenoma  . COLONOSCOPY  11/14/2011   RMR: Friable anal rectum-likely source of hematochezia Early sigmoid diverticular changes. Colonic tubular adenoma removed   . COLONOSCOPY N/A 05/17/2015   RMR: Internal hemorrhoids likely source of hematochezia in the setting of significant constipation. Colonic  diverticulosis.   . Ileocolonoscopy  08/22/2008   Rourk- Friable anal canal, otherwise normal-appearing rectal mucosa, early shallow left-sided diverticula, colonic mucosa, and  terminal ileal mucosa appeared normal.   . REPLACEMENT TOTAL KNEE  2002    OB History    Gravida Para Term Preterm AB Living   3 2 1 1 1 2    SAB TAB Ectopic Multiple Live Births   1               Home Medications    Prior to Admission medications   Medication Sig Start Date End Date Taking? Authorizing Provider  acetaminophen (TYLENOL) 500 MG tablet Take 1,000 mg by mouth every 6 (six) hours as needed for mild pain, moderate pain or fever.   Yes [provider]  docusate sodium (COLACE) 100 MG capsule Take 1 capsule (100 mg total) by mouth every 12 (twelve) hours. Patient taking differently: Take 100-200 mg by mouth every 12 (twelve) hours.  01/31/17  Yes Horton, Barbette Hair, MD  lisinopril (PRINIVIL,ZESTRIL) 20 MG tablet Take 20 mg by mouth daily.    Yes [provider]  Vitamin D, Ergocalciferol, (DRISDOL) 50000 units CAPS capsule Take 50,000 Units by mouth every 30 (thirty) days.   Yes [provider]  ciprofloxacin (CIPRO) 500 MG tablet Take 500 mg by mouth 2 (two) times daily.    [provider]  ciprofloxacin-dexamethasone (CIPRODEX) OTIC suspension Place 4 drops into the right ear 2 (two) times daily. Patient not taking: Reported on 02/04/2017 12/25/16  Khatri, Hina, PA-C  hydrocortisone (ANUSOL-HC) 25 MG suppository Place 1 suppository (25 mg total) rectally 2 (two) times daily. For 7 days Patient not taking: Reported on 02/04/2017 02/01/17   Mesner, Corene Cornea, MD  methocarbamol (ROBAXIN) 500 MG tablet Take 1 tablet (500 mg total) by mouth every 8 (eight) hours as needed for muscle spasms. 02/04/17   Jola Schmidt, MD  oxyCODONE-acetaminophen (PERCOCET/ROXICET) 5-325 MG tablet Take 0.5-1 tablets by mouth every 8 (eight) hours as needed for severe pain. Patient not taking:  Reported on 02/04/2017 02/01/17   Mesner, Corene Cornea, MD  polyethylene glycol [facility]) packet Take 17 g by mouth daily. Patient not taking: Reported on 02/01/2017 01/31/17   Horton, Barbette Hair, MD    Family History Family History  Problem Relation Age of Onset  . Colon cancer Brother 26  . Lung cancer Brother   . Colon cancer Father 66  . Colon cancer Brother 29    Social History Social History  Substance Use Topics  . Smoking status: Never Smoker  . Smokeless tobacco: Never Used  . Alcohol use No     Allergies   Ketorolac tromethamine; Ivp dye [iodinated diagnostic agents]; Nitrofuran derivatives; Vicodin [hydrocodone-acetaminophen]; and Latex   Review of Systems Review of Systems  All other systems reviewed and are negative.    Physical Exam Updated Vital Signs BP (!) 174/91 (BP Location: Right Arm)   Pulse (!) 54   Temp 98.2 F (36.8 C) (Oral)   Resp 17   SpO2 100%   Physical Exam  Constitutional: She is oriented to person, place, and time. She appears well-developed and well-nourished. No distress.  HENT:  Head: Normocephalic and atraumatic.  Eyes: EOM are normal.  Neck: Normal range of motion.  Cardiovascular: Normal rate.   Pulmonary/Chest: Effort normal.  Abdominal: There is no tenderness.  Musculoskeletal: Normal range of motion.  Mild paralumbar tenderness and mild paralumbar spasm.  No rash noted.  Neurological: She is alert and oriented to person, place, and time.  Skin: Skin is warm and dry.  Psychiatric: She has a normal mood and affect. Judgment normal.  Nursing note and vitals reviewed.    ED Treatments / Results  Labs (all labs ordered are listed, but only abnormal results are displayed) Labs Reviewed - No data to display  EKG  EKG Interpretation None       Radiology No results found.  Procedures Procedures (including critical care time)  Medications Ordered in ED Medications  oxyCODONE-acetaminophen (PERCOCET/ROXICET) 5-325 MG  per tablet 2 tablet (2 tablets Oral Given 02/04/17 1250)  methocarbamol (ROBAXIN) tablet 500 mg (500 mg Oral Given 02/04/17 1250)  ibuprofen (ADVIL,MOTRIN) tablet 600 mg (600 mg Oral Given 02/04/17 1249)     Initial Impression / Assessment and Plan / ED Course  I have reviewed the triage vital signs and the nursing notes.  Pertinent labs & imaging results that were available during my care of the patient were reviewed by me and considered in my medical decision making (see chart for details).     Likely muscle skeletal lumbar pain with lumbar spasm.  No radicular symptoms.  No indication for advanced imaging today.  CT scan from 4 days ago reviewed.  Urine culture reviewed and negative.  No urinary symptoms at this time.  Will add muscle relaxants.  She needs close primary care follow-up.  She may benefit from physical therapy.  She may benefit from chiropractic evaluation.  Final Clinical Impressions(s) / ED Diagnoses   Final diagnoses:  Acute  bilateral low back pain without sciatica    New Prescriptions New Prescriptions   METHOCARBAMOL (ROBAXIN) 500 MG TABLET    Take 1 tablet (500 mg total) by mouth every 8 (eight) hours as needed for muscle spasms.     Jola Schmidt, MD 02/04/17 1326

## 2017-02-13 MED FILL — Oxycodone w/ Acetaminophen Tab 5-325 MG: ORAL | Qty: 6 | Status: AC

## 2017-02-18 IMAGING — CT CT ABD-PELV W/ CM
2 of 5 series · 16 of 46 positions shown, 18 images · IV contrast (APPLIED)
Comparison: CT of the abdomen and pelvis performed 10/30/2009

CLINICAL DATA: Acute onset of right groin pain and tenderness.
Initial encounter.

EXAM:
CT ABDOMEN AND PELVIS WITH CONTRAST
TECHNIQUE: Multidetector CT imaging of the abdomen and pelvis was performed
using the standard protocol following bolus administration of
intravenous contrast.
CONTRAST:  100mL 2EKQLH-0UU IOPAMIDOL (2EKQLH-0UU) INJECTION 61%

[Series 2: axial st · axial · 0.74mm/px · z∈[-862,-462]mm · 13 of 90 slices shown, 15 images]
[im 5/90  soft-tissue]
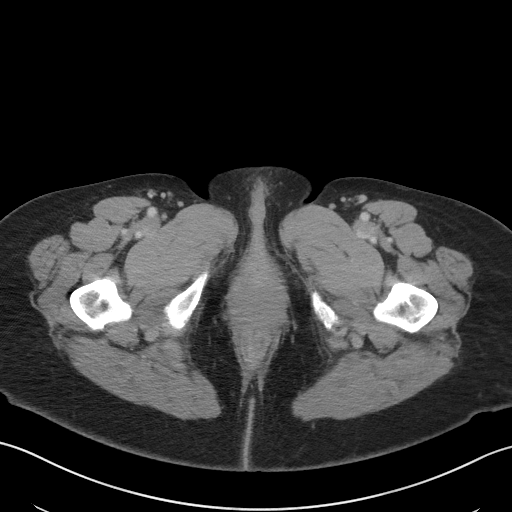
[im 5/90  bone]
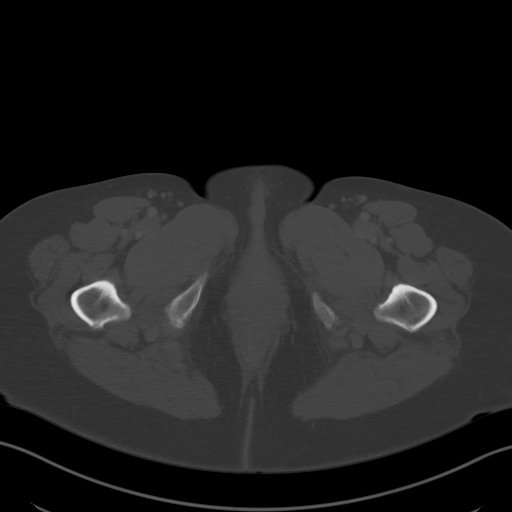
[im 10/90  soft-tissue]
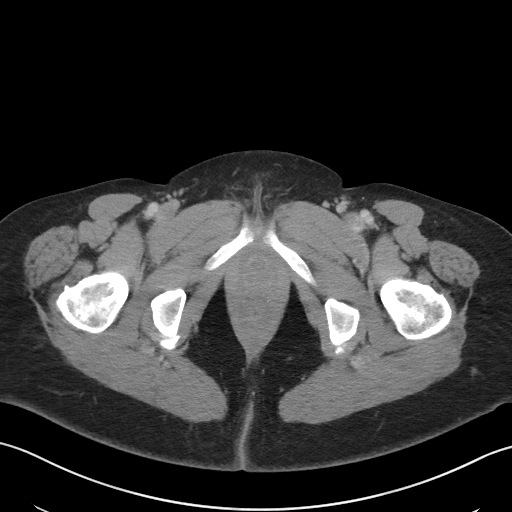
[im 20/90  soft-tissue]
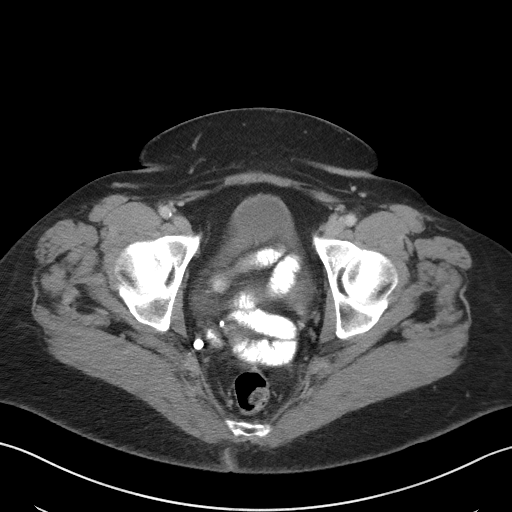
[im 25/90  soft-tissue]
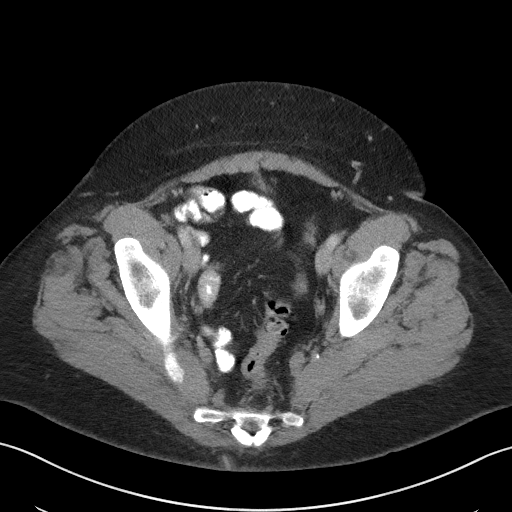
[im 30/90  soft-tissue]
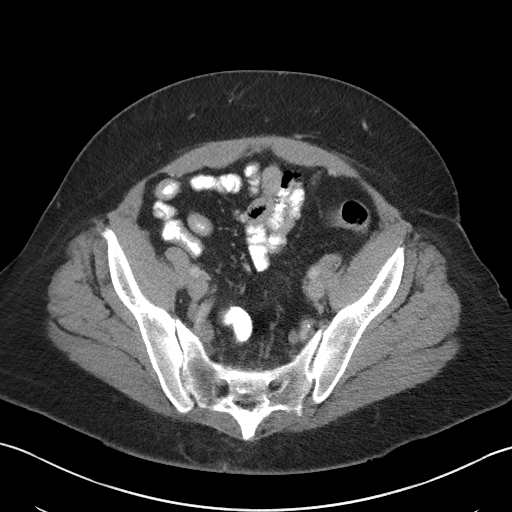
[im 40/90  soft-tissue]
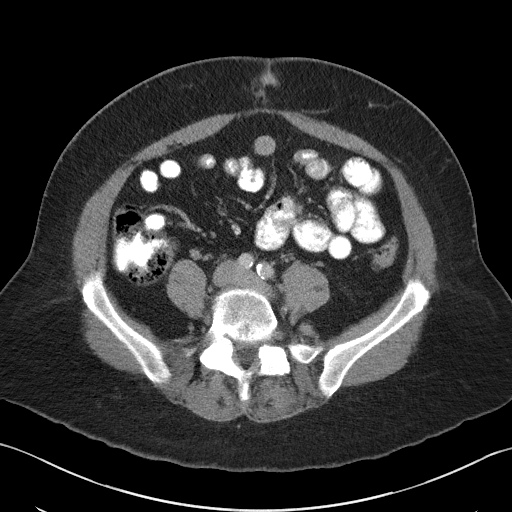
[im 45/90  soft-tissue]
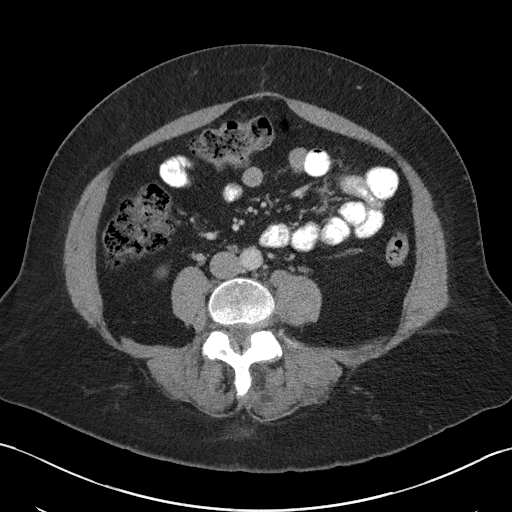
[im 50/90  soft-tissue]
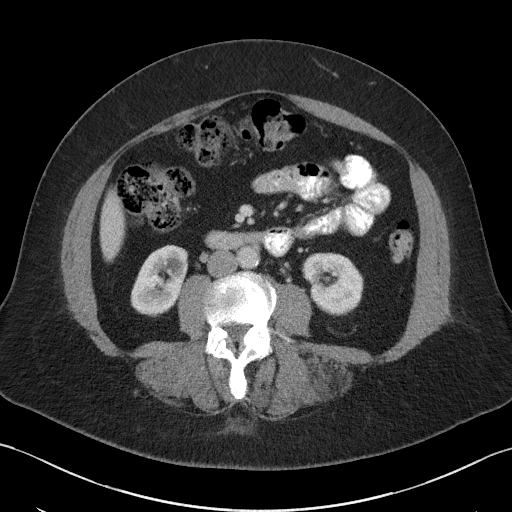
[im 60/90  soft-tissue]
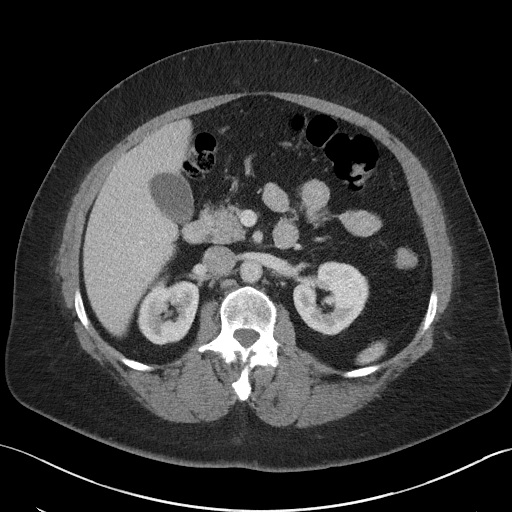
[im 60/90  bone]
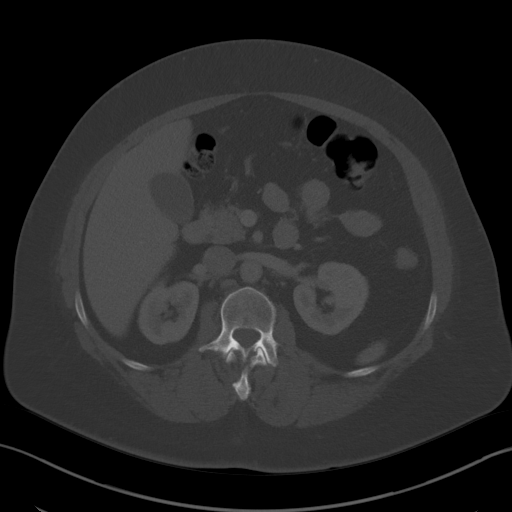
[im 65/90  soft-tissue]
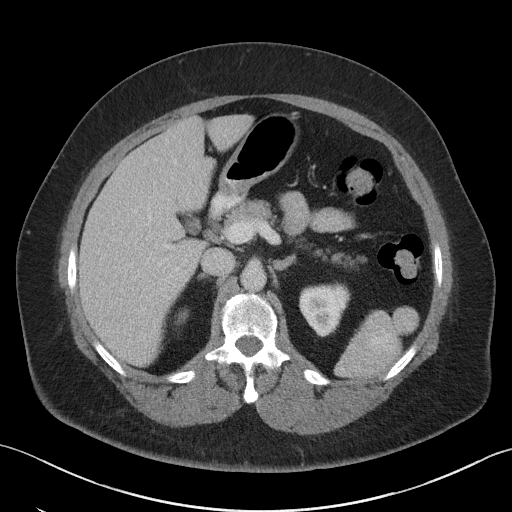
[im 70/90  soft-tissue]
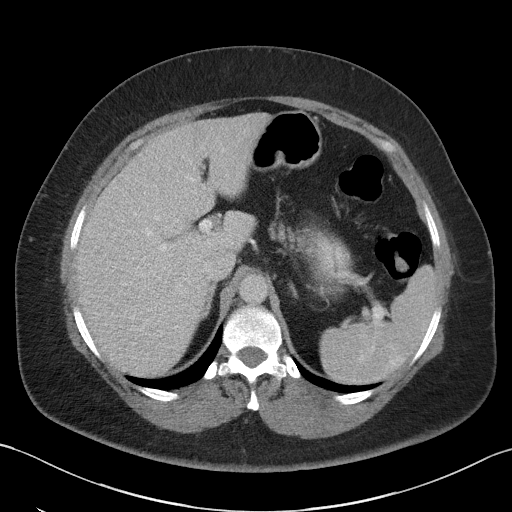
[im 80/90  soft-tissue]
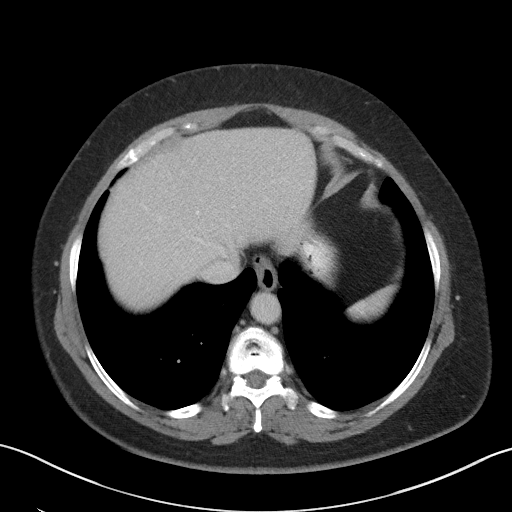
[im 85/90  soft-tissue]
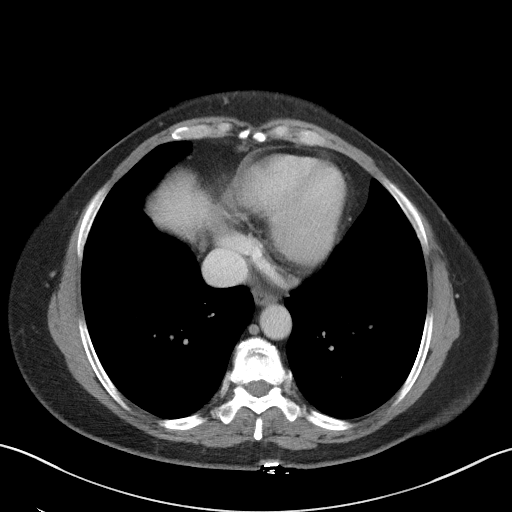

[Series 4: coronal st · coronal · 0.78mm/px · 3 of 101 slices shown]
[im 34/101  soft-tissue]
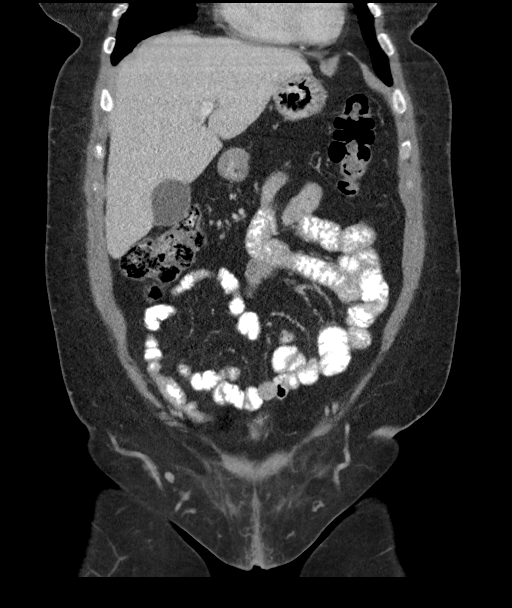
[im 45/101  soft-tissue]
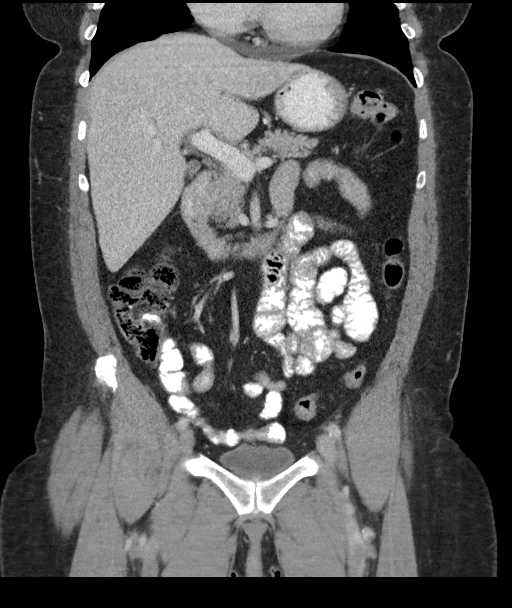
[im 56/101  soft-tissue]
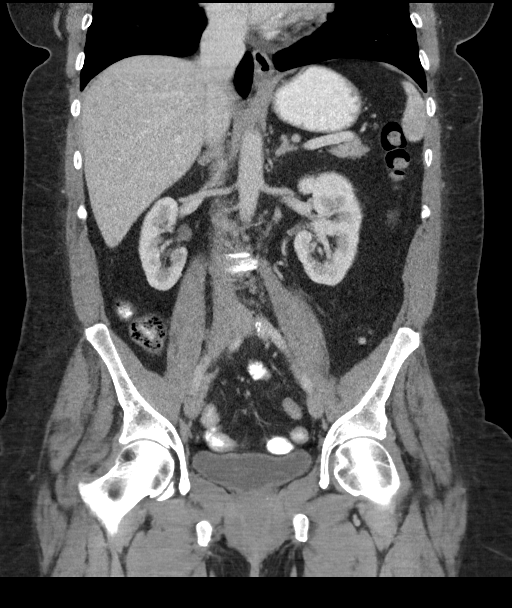

[16 of 46 positions shown; findings below may reference images not displayed]

FINDINGS: Lower chest: A trace pericardial effusion is identified. The
visualized lung bases are grossly clear.

Hepatobiliary: The liver is unremarkable in appearance. The
gallbladder is unremarkable in appearance. The common bile duct
remains normal in caliber.

Pancreas: The pancreas is within normal limits.

Spleen: The spleen is unremarkable in appearance.

Adrenals/Urinary Tract: The adrenal glands are unremarkable in
appearance. Small cysts are noted near the lower pole of the right
kidney

Mild focal decreased enhancement at the lower pole of the right
kidney could reflect mild pyelonephritis. A small right renal cyst
is noted. There is no evidence of hydronephrosis. No renal or
ureteral stones are identified. No perinephric stranding is seen.

Stomach/Bowel: The stomach is unremarkable in appearance. The small
bowel is within normal limits. The patient is status post
appendectomy. Mild diverticulosis is noted along the distal
descending and proximal sigmoid colon, without evidence of
diverticulitis.

Vascular/Lymphatic: Minimal calcification is noted at the distal
abdominal aorta and its branches. No retroperitoneal or pelvic
sidewall lymphadenopathy is seen.

Reproductive: The patient is status post hysterectomy. The bladder
is mildly distended and grossly unremarkable. No suspicious adnexal
masses are seen.

Other: No additional soft tissue abnormalities are seen.

Musculoskeletal: No acute osseous abnormalities are identified. Disc
space narrowing is noted at L3-L4 and L5-S1, with associated vacuum
phenomenon. The visualized musculature is unremarkable in
appearance.
IMPRESSION: 1. Mild focal decreased enhancement at the lower pole of the right
kidney could reflect mild pyelonephritis. Would correlate with the
patient's labs.
2. Small right renal cyst noted.
3. Mild diverticulosis at the distal descending and proximal sigmoid
colon, without evidence of diverticulitis.
4. Mild degenerative change at the lower lumbar spine.
5. Trace pericardial effusion noted.

## 2017-07-03 ENCOUNTER — Ambulatory Visit (INDEPENDENT_AMBULATORY_CARE_PROVIDER_SITE_OTHER): Payer: Medicaid Other | Admitting: Otolaryngology

## 2017-07-03 DIAGNOSIS — J310 Chronic rhinitis: Secondary | ICD-10-CM

## 2017-07-03 DIAGNOSIS — H903 Sensorineural hearing loss, bilateral: Secondary | ICD-10-CM | POA: Diagnosis not present

## 2017-07-03 DIAGNOSIS — R040 Epistaxis: Secondary | ICD-10-CM

## 2017-10-15 ENCOUNTER — Encounter (HOSPITAL_COMMUNITY): Payer: Self-pay | Admitting: *Deleted

## 2017-10-15 ENCOUNTER — Emergency Department (HOSPITAL_COMMUNITY)
Admission: EM | Admit: 2017-10-15 | Discharge: 2017-10-15 | Disposition: A | Discharge status: Home / Self Care | Payer: Medicaid Other | Attending: Emergency Medicine | Admitting: Emergency Medicine

## 2017-10-15 ENCOUNTER — Other Ambulatory Visit: Payer: Self-pay

## 2017-10-15 DIAGNOSIS — Z79899 Other long term (current) drug therapy: Secondary | ICD-10-CM | POA: Diagnosis not present

## 2017-10-15 DIAGNOSIS — Y939 Activity, unspecified: Secondary | ICD-10-CM | POA: Insufficient documentation

## 2017-10-15 DIAGNOSIS — Z91040 Latex allergy status: Secondary | ICD-10-CM | POA: Insufficient documentation

## 2017-10-15 DIAGNOSIS — N189 Chronic kidney disease, unspecified: Secondary | ICD-10-CM | POA: Insufficient documentation

## 2017-10-15 DIAGNOSIS — S46811A Strain of other muscles, fascia and tendons at shoulder and upper arm level, right arm, initial encounter: Secondary | ICD-10-CM | POA: Diagnosis not present

## 2017-10-15 DIAGNOSIS — Y999 Unspecified external cause status: Secondary | ICD-10-CM | POA: Insufficient documentation

## 2017-10-15 DIAGNOSIS — Z96659 Presence of unspecified artificial knee joint: Secondary | ICD-10-CM | POA: Diagnosis not present

## 2017-10-15 DIAGNOSIS — M25511 Pain in right shoulder: Secondary | ICD-10-CM | POA: Diagnosis present

## 2017-10-15 DIAGNOSIS — M542 Cervicalgia: Secondary | ICD-10-CM | POA: Insufficient documentation

## 2017-10-15 DIAGNOSIS — I129 Hypertensive chronic kidney disease with stage 1 through stage 4 chronic kidney disease, or unspecified chronic kidney disease: Secondary | ICD-10-CM | POA: Insufficient documentation

## 2017-10-15 DIAGNOSIS — Y33XXXA Other specified events, undetermined intent, initial encounter: Secondary | ICD-10-CM | POA: Diagnosis not present

## 2017-10-15 DIAGNOSIS — Y929 Unspecified place or not applicable: Secondary | ICD-10-CM | POA: Diagnosis not present

## 2017-10-15 MED ORDER — DIAZEPAM 5 MG PO TABS
10.0000 mg | ORAL_TABLET | Freq: Once | ORAL | Status: AC
  Administered 2017-10-15: 10 mg via ORAL
  Filled 2017-10-15: qty 2

## 2017-10-15 MED ORDER — DEXAMETHASONE SODIUM PHOSPHATE 10 MG/ML IJ SOLN
10.0000 mg | Freq: Once | INTRAMUSCULAR | Status: AC
  Administered 2017-10-15: 10 mg via INTRAMUSCULAR
  Filled 2017-10-15: qty 1

## 2017-10-15 MED ORDER — PREDNISONE 20 MG PO TABS: ORAL_TABLET | ORAL | 0 refills | Status: DC

## 2017-10-15 MED ORDER — CYCLOBENZAPRINE HCL 5 MG PO TABS: 5.0000 mg | ORAL_TABLET | Freq: Three times a day (TID) | ORAL | 0 refills | Status: DC | PRN

## 2017-10-15 NOTE — Discharge Instructions (Addendum)
Use ice and heat over your painful muscles for comfort. Take the medications as prescribed. Recheck if not improving over the next week.

## 2017-10-15 NOTE — ED Provider Notes (Signed)
[facility] EMERGENCY DEPARTMENT Provider Note   CSN: [id number] Arrival date & time: 10/15/17  0425  Time seen 04:55 AM   History   Chief Complaint Chief Complaint  Patient presents with  . Shoulder Pain    HPI [name] [name] is a 67 y.o. female.  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  Last week they did inventory of the large containers of food and she also was taking out the trash cans with scrap food.  She states she started having pain in her right shoulder 3 days ago.  She states movement of her head especially to the left or movement of her right arm makes the pain worse.  Nothing makes it feel better.  She denies any numbness or tingling of her extremities.  She states she feels like she has weakness in her right hand but it hurts when she uses it.  Patient is right-handed.  She denies having this problem before.  She has been doing this job approximately 6 months.  Patient states she had some neck pain last year that was treated at the [facility] emergency department and she did not have to see anybody afterwards.  PCP Chiquita Loth, MD Orthopedics Dr Veverly Fells (for knees)  Past Medical History:  Diagnosis Date  . Adenomatous colon polyp 2007  . Chronic renal insufficiency   . DDD (degenerative disc disease), lumbar   . Depression   . Diverticulosis 2010  . Glucose intolerance (impaired glucose tolerance)   . Hyperlipidemia   . Hypertension   . Tubular adenoma of colon 11/2011    Patient Active Problem List   Diagnosis Date Noted  . Rectal bleeding 05/12/2015  . Tubular adenoma of colon 07/30/2012  . Vomiting 07/30/2012  . Family history of colon cancer 10/15/2010  . MIXED HYPERLIPIDEMIA 04/03/2010  . ESSENTIAL HYPERTENSION, BENIGN 04/03/2010  . CHEST PAIN 04/03/2010  . HEMORRHOIDS 11/07/2009  . CONSTIPATION, CHRONIC 11/07/2009  . HEMATOCHEZIA 11/07/2009  . COLONIC POLYPS, ADENOMATOUS, HX OF  11/07/2009    Past Surgical History:  Procedure Laterality Date  . APPENDECTOMY    . COLONOSCOPY  08/02/2005   Rourk-Internal hemorrhoids, otherwise normal rectum. adenoma  . COLONOSCOPY  11/14/2011   RMR: Friable anal rectum-likely source of hematochezia Early sigmoid diverticular changes. Colonic tubular adenoma removed   . COLONOSCOPY N/A 05/17/2015   RMR: Internal hemorrhoids likely source of hematochezia in the setting of significant constipation. Colonic diverticulosis.   . Ileocolonoscopy  08/22/2008   Rourk- Friable anal canal, otherwise normal-appearing rectal mucosa, early shallow left-sided diverticula, colonic mucosa, and  terminal ileal mucosa appeared normal.   . REPLACEMENT TOTAL KNEE  2002     OB History    Gravida  3   Para  2   Term  1   Preterm  1   AB  1   Living  2     SAB  1   TAB      Ectopic      Multiple      Live Births               Home Medications    Prior to Admission medications   Medication Sig Start Date End Date Taking? Authorizing Provider  acetaminophen (TYLENOL) 500 MG tablet Take 1,000 mg by mouth every 6 (six) hours as needed for mild pain, moderate pain or fever.    [provider]  ciprofloxacin (CIPRO) 500 MG tablet Take 500 mg by mouth 2 (two) times daily.    [provider]  ciprofloxacin-dexamethasone (CIPRODEX) OTIC suspension Place 4 drops into the right ear 2 (two) times daily. Patient not taking: Reported on 02/04/2017 12/25/16   Delia Heady, PA-C  cyclobenzaprine (FLEXERIL) 5 MG tablet Take 1 tablet (5 mg total) by mouth 3 (three) times daily as needed. 10/15/17   Rolland Porter, MD  docusate sodium (COLACE) 100 MG capsule Take 1 capsule (100 mg total) by mouth every 12 (twelve) hours. Patient taking differently: Take 100-200 mg by mouth every 12 (twelve) hours.  01/31/17   Horton, Barbette Hair, MD  hydrocortisone (ANUSOL-HC) 25 MG suppository Place 1 suppository (25 mg total) rectally 2 (two) times  daily. For 7 days Patient not taking: Reported on 02/04/2017 02/01/17   Mesner, Corene Cornea, MD  lisinopril (PRINIVIL,ZESTRIL) 20 MG tablet Take 20 mg by mouth daily.     [provider]  methocarbamol (ROBAXIN) 500 MG tablet Take 1 tablet (500 mg total) by mouth every 8 (eight) hours as needed for muscle spasms. 02/04/17   Jola Schmidt, MD  oxyCODONE-acetaminophen (PERCOCET/ROXICET) 5-325 MG tablet Take 0.5-1 tablets by mouth every 8 (eight) hours as needed for severe pain. Patient not taking: Reported on 02/04/2017 02/01/17   Mesner, Corene Cornea, MD  polyethylene glycol [facility]) packet Take 17 g by mouth daily. Patient not taking: Reported on 02/01/2017 01/31/17   Horton, Barbette Hair, MD  predniSONE (DELTASONE) 20 MG tablet Take 3 po QD x 3d , then 2 po QD x 3d then 1 po QD x 3d 10/15/17   Rolland Porter, MD  Vitamin D, Ergocalciferol, (DRISDOL) 50000 units CAPS capsule Take 50,000 Units by mouth every 30 (thirty) days.    [provider]    Family History Family History  Problem Relation Age of Onset  . Colon cancer Brother 40  . Lung cancer Brother   . Colon cancer Father 30  . Colon cancer Brother 72    Social History Social History   Tobacco Use  . Smoking status: Never Smoker  . Smokeless tobacco: Never Used  Substance Use Topics  . Alcohol use: No  . Drug use: No  employed   Allergies   Ketorolac tromethamine; Ivp dye [iodinated diagnostic agents]; Nitrofuran derivatives; Vicodin [hydrocodone-acetaminophen]; and Latex   Review of Systems Review of Systems  All other systems reviewed and are negative.    Physical Exam Updated Vital Signs BP (!) 178/118 (BP Location: Right Arm)   Pulse 61   Temp 97.9 F (36.6 C) (Oral)   Resp 14   Ht 5\' 7"  (1.702 m)   Wt 93 kg (205 lb)   SpO2 98%   BMI 32.11 kg/m   Vital signs normal    Physical Exam  Constitutional: She appears well-developed and well-nourished. She appears distressed.  HENT:  Head: Normocephalic and  atraumatic.  Right Ear: External ear normal.  Left Ear: External ear normal.  Nose: Nose normal.  Eyes: Conjunctivae and EOM are normal.  Neck: Normal range of motion.    Patient is very tender to palpation over the right trapezius, she also has pain in the same area when she turns her head to the left.  When she starts to abduct her right arm it also is causes pain and trapezius muscle.  Nontender over the midline cervical spine.  Cardiovascular: Normal rate.  Pulmonary/Chest: Effort normal. No respiratory distress.    Musculoskeletal:  When I examined patient's shoulder  she does not have pain in the actual shoulder.  And when I abduct her arm she does not hurt in the actual shoulder joint but in the trapezius muscle.  She also complains of some tightness in her right chest area when she moves her head or moves the arm.  Nursing note and vitals reviewed.    ED Treatments / Results  Labs (all labs ordered are listed, but only abnormal results are displayed) Labs Reviewed - No data to display  EKG None  Radiology No results found.  Procedures Procedures (including critical care time)  Medications Ordered in ED Medications  dexamethasone (DECADRON) injection 10 mg (10 mg Intramuscular Given 10/15/17 0513)  diazepam (VALIUM) tablet 10 mg (10 mg Oral Given 10/15/17 0513)     Initial Impression / Assessment and Plan / ED Course  I have reviewed the triage vital signs and the nursing notes.  Pertinent labs & imaging results that were available during my care of the patient were reviewed by me and considered in my medical decision making (see chart for details).    Patient's pain appears to be musculoskeletal.  She is very tender over her right trapezius muscle which seems to reproduce the majority of her discomfort.  She was given Decadron IM because she has a Toradol allergy and Valium orally.  Her daughter is here and can drive her home.  Patient denies having a history of  diabetes.  She does not having any midline cervical spine tenderness so cervical spine x-rays were not done.   Final Clinical Impressions(s) / ED Diagnoses   Final diagnoses:  Trapezius strain, right, initial encounter    ED Discharge Orders        Ordered    predniSONE (DELTASONE) 20 MG tablet     10/15/17 0530    cyclobenzaprine (FLEXERIL) 5 MG tablet  3 times daily PRN     10/15/17 0530     Plan discharge  Rolland Porter, MD, Barbette Or, MD 10/15/17 [phone]

## 2017-10-15 NOTE — ED Triage Notes (Signed)
Pt c/o right shoulder pain that radiates to her chest; pt states she has had the pain for the last 3 days; pt denies any obvious injury; pt has limited rom to right arm

## 2017-11-12 ENCOUNTER — Encounter (HOSPITAL_COMMUNITY): Payer: Self-pay | Admitting: Emergency Medicine

## 2017-11-12 ENCOUNTER — Emergency Department (HOSPITAL_COMMUNITY)
Admission: EM | Admit: 2017-11-12 | Discharge: 2017-11-12 | Disposition: A | Discharge status: Home / Self Care | Payer: Medicaid Other | Attending: Emergency Medicine | Admitting: Emergency Medicine

## 2017-11-12 ENCOUNTER — Other Ambulatory Visit: Payer: Self-pay

## 2017-11-12 DIAGNOSIS — J069 Acute upper respiratory infection, unspecified: Secondary | ICD-10-CM | POA: Diagnosis not present

## 2017-11-12 DIAGNOSIS — Z91040 Latex allergy status: Secondary | ICD-10-CM | POA: Insufficient documentation

## 2017-11-12 DIAGNOSIS — Z96659 Presence of unspecified artificial knee joint: Secondary | ICD-10-CM | POA: Diagnosis not present

## 2017-11-12 DIAGNOSIS — Z79899 Other long term (current) drug therapy: Secondary | ICD-10-CM | POA: Diagnosis not present

## 2017-11-12 DIAGNOSIS — I10 Essential (primary) hypertension: Secondary | ICD-10-CM | POA: Insufficient documentation

## 2017-11-12 DIAGNOSIS — H9203 Otalgia, bilateral: Secondary | ICD-10-CM | POA: Diagnosis present

## 2017-11-12 MED ORDER — CETIRIZINE HCL 10 MG PO TABS: 10.0000 mg | ORAL_TABLET | Freq: Every day | ORAL | 0 refills | Status: DC

## 2017-11-12 MED ORDER — MOMETASONE FUROATE 50 MCG/ACT NA SUSP: 2.0000 | Freq: Every day | NASAL | 12 refills | Status: DC

## 2017-11-12 NOTE — ED Provider Notes (Signed)
[facility] EMERGENCY DEPARTMENT Provider Note   CSN: [id number] Arrival date & time: 11/12/17  1532     History   Chief Complaint Chief Complaint  Patient presents with  . Otalgia    HPI [name] [name] is a 67 y.o. female who presents to the ED with bilateral ear pain and sore throat. The ear fullness started about a week ago and the sore throat started yesterday.   The history is provided by the patient. No language interpreter was used.  URI   This is a new problem. The current episode started more than 2 days ago. There has been no fever. Associated symptoms include ear pain, plugged ear sensation and sore throat. Pertinent negatives include no chest pain, no abdominal pain, no vomiting, no headaches, no cough and no rash. She has tried nothing for the symptoms.    Past Medical History:  Diagnosis Date  . Adenomatous colon polyp 2007  . Chronic renal insufficiency   . DDD (degenerative disc disease), lumbar   . Depression   . Diverticulosis 2010  . Glucose intolerance (impaired glucose tolerance)   . Hyperlipidemia   . Hypertension   . Tubular adenoma of colon 11/2011    Patient Active Problem List   Diagnosis Date Noted  . Rectal bleeding 05/12/2015  . Tubular adenoma of colon 07/30/2012  . Vomiting 07/30/2012  . Family history of colon cancer 10/15/2010  . MIXED HYPERLIPIDEMIA 04/03/2010  . ESSENTIAL HYPERTENSION, BENIGN 04/03/2010  . CHEST PAIN 04/03/2010  . HEMORRHOIDS 11/07/2009  . CONSTIPATION, CHRONIC 11/07/2009  . HEMATOCHEZIA 11/07/2009  . COLONIC POLYPS, ADENOMATOUS, HX OF 11/07/2009    Past Surgical History:  Procedure Laterality Date  . APPENDECTOMY    . COLONOSCOPY  08/02/2005   Rourk-Internal hemorrhoids, otherwise normal rectum. adenoma  . COLONOSCOPY  11/14/2011   RMR: Friable anal rectum-likely source of hematochezia Early sigmoid diverticular changes. Colonic tubular adenoma removed   . COLONOSCOPY N/A 05/17/2015   RMR: Internal hemorrhoids  likely source of hematochezia in the setting of significant constipation. Colonic diverticulosis.   . Ileocolonoscopy  08/22/2008   Rourk- Friable anal canal, otherwise normal-appearing rectal mucosa, early shallow left-sided diverticula, colonic mucosa, and  terminal ileal mucosa appeared normal.   . REPLACEMENT TOTAL KNEE  2002     OB History    Gravida  3   Para  2   Term  1   Preterm  1   AB  1   Living  2     SAB  1   TAB      Ectopic      Multiple      Live Births               Home Medications    Prior to Admission medications   Medication Sig Start Date End Date Taking? Authorizing Provider  acetaminophen (TYLENOL) 500 MG tablet Take 1,000 mg by mouth every 6 (six) hours as needed for mild pain, moderate pain or fever.    [provider]  cetirizine (ZYRTEC) 10 MG tablet Take 1 tablet (10 mg total) by mouth daily. 11/12/17   Ashley Murrain, NP  cyclobenzaprine (FLEXERIL) 5 MG tablet Take 1 tablet (5 mg total) by mouth 3 (three) times daily as needed. 10/15/17   Rolland Porter, MD  docusate sodium (COLACE) 100 MG capsule Take 1 capsule (100 mg total) by mouth every 12 (twelve) hours. Patient taking differently: Take 100-200 mg by mouth every 12 (twelve) hours.  01/31/17   Horton, Barbette Hair, MD  lisinopril (PRINIVIL,ZESTRIL) 20 MG tablet Take 20 mg by mouth daily.     [provider]  methocarbamol (ROBAXIN) 500 MG tablet Take 1 tablet (500 mg total) by mouth every 8 (eight) hours as needed for muscle spasms. 02/04/17   Jola Schmidt, MD  mometasone (NASONEX) 50 MCG/ACT nasal spray Place 2 sprays into the nose daily. 11/12/17   Ashley Murrain, NP  polyethylene glycol [facility]) packet Take 17 g by mouth daily. Patient not taking: Reported on 02/01/2017 01/31/17   Horton, Barbette Hair, MD  Vitamin D, Ergocalciferol, (DRISDOL) 50000 units CAPS capsule Take 50,000 Units by mouth every 30 (thirty) days.    [provider]    Family History Family  History  Problem Relation Age of Onset  . Colon cancer Brother 46  . Lung cancer Brother   . Colon cancer Father 42  . Colon cancer Brother 105    Social History Social History   Tobacco Use  . Smoking status: Never Smoker  . Smokeless tobacco: Never Used  Substance Use Topics  . Alcohol use: No  . Drug use: No     Allergies   Ketorolac tromethamine; Ivp dye [iodinated diagnostic agents]; Nitrofuran derivatives; Vicodin [hydrocodone-acetaminophen]; and Latex   Review of Systems Review of Systems  Constitutional: Negative for chills and fever.  HENT: Positive for ear pain, postnasal drip and sore throat. Negative for trouble swallowing.   Eyes: Negative for pain, discharge and itching.  Respiratory: Negative for cough and shortness of breath.   Cardiovascular: Negative for chest pain.  Gastrointestinal: Negative for abdominal pain and vomiting.  Musculoskeletal: Negative for myalgias.  Skin: Negative for rash.  Neurological: Negative for headaches.  Hematological: Positive for adenopathy.  Psychiatric/Behavioral: Negative for confusion.     Physical Exam Updated Vital Signs BP (!) 165/90 (BP Location: Right Arm)   Pulse 60   Temp 98.1 F (36.7 C) (Oral)   Resp 16   Ht 5\' 7"  (1.702 m)   Wt 93 kg (205 lb)   SpO2 100%   BMI 32.11 kg/m   Physical Exam  Constitutional: She appears well-developed and well-nourished. No distress.  HENT:  Head: Normocephalic and atraumatic.  Right Ear: Tympanic membrane is retracted.  Left Ear: Tympanic membrane normal.  Nose: Rhinorrhea present.  Mouth/Throat: Uvula is midline and oropharynx is clear and moist.  Eyes: Pupils are equal, round, and reactive to light. Conjunctivae and EOM are normal.  Neck: Normal range of motion. Neck supple.  Cardiovascular: Normal rate and regular rhythm.  Pulmonary/Chest: Effort normal and breath sounds normal.  Musculoskeletal: Normal range of motion.  Lymphadenopathy:    She has cervical  adenopathy.  Neurological: She is alert.  Skin: Skin is warm and dry.  Psychiatric: She has a normal mood and affect. Her behavior is normal.  Nursing note and vitals reviewed.    ED Treatments / Results  Labs (all labs ordered are listed, but only abnormal results are displayed) Labs Reviewed - No data to display  Radiology No results found.  Procedures Procedures (including critical care time)  Medications Ordered in ED Medications - No data to display   Initial Impression / Assessment and Plan / ED Course  I have reviewed the triage vital signs and the nursing notes. 67 y.o. female with stopped up ears and post nasal drainage. Patients symptoms are consistent with URI, likely viral etiology. Discussed that antibiotics are not indicated for viral infections. Pt will be  discharged with symptomatic treatment.  Verbalizes understanding and is agreeable with plan. Pt is hemodynamically stable & in NAD prior to dc.  Final Clinical Impressions(s) / ED Diagnoses   Final diagnoses:  URI, acute    ED Discharge Orders        Ordered    mometasone (NASONEX) 50 MCG/ACT nasal spray  Daily     11/12/17 1611    cetirizine (ZYRTEC) 10 MG tablet  Daily     11/12/17 1611       Debroah Baller [place], NP 11/12/17 1617    Davonna Belling, MD 11/12/17 [id number]

## 2017-11-12 NOTE — ED Triage Notes (Signed)
Patient c/o bilateral ear pain and sore throat x2 days. Denies any fevers. Per patient yellow drainage from ears today.

## 2017-11-12 NOTE — Discharge Instructions (Addendum)
Follow up with your doctor. Return here as needed for worsening symptoms.

## 2017-12-28 ENCOUNTER — Emergency Department (HOSPITAL_COMMUNITY)
Admission: EM | Admit: 2017-12-28 | Discharge: 2017-12-28 | Disposition: A | Discharge status: Home / Self Care | Payer: Medicaid Other | Attending: Emergency Medicine | Admitting: Emergency Medicine

## 2017-12-28 ENCOUNTER — Emergency Department (HOSPITAL_COMMUNITY): Payer: Medicaid Other

## 2017-12-28 ENCOUNTER — Encounter (HOSPITAL_COMMUNITY): Payer: Self-pay | Admitting: Emergency Medicine

## 2017-12-28 ENCOUNTER — Other Ambulatory Visit: Payer: Self-pay

## 2017-12-28 DIAGNOSIS — I129 Hypertensive chronic kidney disease with stage 1 through stage 4 chronic kidney disease, or unspecified chronic kidney disease: Secondary | ICD-10-CM | POA: Diagnosis not present

## 2017-12-28 DIAGNOSIS — Z79899 Other long term (current) drug therapy: Secondary | ICD-10-CM | POA: Diagnosis not present

## 2017-12-28 DIAGNOSIS — Z91040 Latex allergy status: Secondary | ICD-10-CM | POA: Insufficient documentation

## 2017-12-28 DIAGNOSIS — R109 Unspecified abdominal pain: Secondary | ICD-10-CM | POA: Diagnosis present

## 2017-12-28 DIAGNOSIS — N189 Chronic kidney disease, unspecified: Secondary | ICD-10-CM | POA: Insufficient documentation

## 2017-12-28 DIAGNOSIS — R1031 Right lower quadrant pain: Secondary | ICD-10-CM | POA: Diagnosis not present

## 2017-12-28 DIAGNOSIS — R300 Dysuria: Secondary | ICD-10-CM | POA: Insufficient documentation

## 2017-12-28 LAB — BASIC METABOLIC PANEL
Anion gap: 7 (ref 5–15)
BUN: 16 mg/dL (ref 6–20)
CALCIUM: 9.5 mg/dL (ref 8.9–10.3)
CO2: 27 mmol/L (ref 22–32)
CREATININE: 1.11 mg/dL — AB (ref 0.44–1.00)
Chloride: 105 mmol/L (ref 101–111)
GFR calc Af Amer: 59 mL/min — ABNORMAL LOW (ref >60)
GFR calc non Af Amer: 51 mL/min — ABNORMAL LOW (ref >60)
Glucose, Bld: 103 mg/dL — ABNORMAL HIGH (ref 65–99)
POTASSIUM: 3.7 mmol/L (ref 3.5–5.1)
SODIUM: 139 mmol/L (ref 135–145)

## 2017-12-28 LAB — CBC WITH DIFFERENTIAL/PLATELET
Basophils Absolute: 0 10*3/uL (ref 0.0–0.1)
Basophils Relative: 0 %
EOS PCT: 2 %
Eosinophils Absolute: 0.2 10*3/uL (ref 0.0–0.7)
HCT: 39.1 % (ref 36.0–46.0)
Hemoglobin: 12.9 g/dL (ref 12.0–15.0)
LYMPHS ABS: 2.5 10*3/uL (ref 0.7–4.0)
LYMPHS PCT: 24 %
MCH: 31.2 pg (ref 26.0–34.0)
MCHC: 33 g/dL (ref 30.0–36.0)
MCV: 94.4 fL (ref 78.0–100.0)
Monocytes Absolute: 0.6 10*3/uL (ref 0.1–1.0)
Monocytes Relative: 5 %
NEUTROS ABS: 7.2 10*3/uL (ref 1.7–7.7)
Neutrophils Relative %: 69 %
PLATELETS: 174 10*3/uL (ref 150–400)
RBC: 4.14 MIL/uL (ref 3.87–5.11)
RDW: 14.3 % (ref 11.5–15.5)
WBC: 10.5 10*3/uL (ref 4.0–10.5)

## 2017-12-28 LAB — URINALYSIS, ROUTINE W REFLEX MICROSCOPIC
BILIRUBIN URINE: NEGATIVE
Glucose, UA: NEGATIVE mg/dL
Hgb urine dipstick: NEGATIVE
Ketones, ur: NEGATIVE mg/dL
Leukocytes, UA: NEGATIVE
NITRITE: NEGATIVE
PH: 6 (ref 5.0–8.0)
Protein, ur: NEGATIVE mg/dL
SPECIFIC GRAVITY, URINE: 1.002 — AB (ref 1.005–1.030)

## 2017-12-28 MED ORDER — PHENAZOPYRIDINE HCL 200 MG PO TABS: 200.0000 mg | ORAL_TABLET | Freq: Three times a day (TID) | ORAL | 0 refills | Status: DC

## 2017-12-28 MED ORDER — HYDROCODONE-ACETAMINOPHEN 5-325 MG PO TABS: 2.0000 | ORAL_TABLET | ORAL | 0 refills | Status: DC | PRN

## 2017-12-28 MED ORDER — ONDANSETRON HCL 4 MG/2ML IJ SOLN
4.0000 mg | Freq: Once | INTRAMUSCULAR | Status: AC
  Administered 2017-12-28: 4 mg via INTRAVENOUS
  Filled 2017-12-28: qty 2

## 2017-12-28 NOTE — ED Provider Notes (Signed)
[facility] EMERGENCY DEPARTMENT Provider Note   CSN: [id number] Arrival date & time: 12/28/17  2026     History   Chief Complaint Chief Complaint  Patient presents with  . Abdominal Pain    HPI [name] [name] is a 67 y.o. female.  HPI  The patient is a 67 year old female, she has a history of a prior hysterectomy as well as an appendectomy, she does report a history of urinary infections a couple of times per year.  She states that over the last couple of days she has had some urinary frequency, gradually worsening, today associated with some suprapubic and right lower quadrant pain, no pain in the back, no vomiting, no diarrhea, normal stools.  She denies any dysuria but has had extreme frequency.  No cloudiness, no foul smell, no nausea or vomiting.  The patient symptoms of been going on for a couple of days, intermittent, gradually worsening, have become severe today.  She has not had any medications prior to arrival.  Past Medical History:  Diagnosis Date  . Adenomatous colon polyp 2007  . Chronic renal insufficiency   . DDD (degenerative disc disease), lumbar   . Depression   . Diverticulosis 2010  . Glucose intolerance (impaired glucose tolerance)   . Hyperlipidemia   . Hypertension   . Tubular adenoma of colon 11/2011    Patient Active Problem List   Diagnosis Date Noted  . Rectal bleeding 05/12/2015  . Tubular adenoma of colon 07/30/2012  . Vomiting 07/30/2012  . Family history of colon cancer 10/15/2010  . MIXED HYPERLIPIDEMIA 04/03/2010  . ESSENTIAL HYPERTENSION, BENIGN 04/03/2010  . CHEST PAIN 04/03/2010  . HEMORRHOIDS 11/07/2009  . CONSTIPATION, CHRONIC 11/07/2009  . HEMATOCHEZIA 11/07/2009  . COLONIC POLYPS, ADENOMATOUS, HX OF 11/07/2009    Past Surgical History:  Procedure Laterality Date  . APPENDECTOMY    . COLONOSCOPY  08/02/2005   Rourk-Internal hemorrhoids, otherwise normal rectum. adenoma  . COLONOSCOPY  11/14/2011   RMR: Friable anal  rectum-likely source of hematochezia Early sigmoid diverticular changes. Colonic tubular adenoma removed   . COLONOSCOPY N/A 05/17/2015   RMR: Internal hemorrhoids likely source of hematochezia in the setting of significant constipation. Colonic diverticulosis.   . Ileocolonoscopy  08/22/2008   Rourk- Friable anal canal, otherwise normal-appearing rectal mucosa, early shallow left-sided diverticula, colonic mucosa, and  terminal ileal mucosa appeared normal.   . REPLACEMENT TOTAL KNEE  2002     OB History    Gravida  3   Para  2   Term  1   Preterm  1   AB  1   Living  2     SAB  1   TAB      Ectopic      Multiple      Live Births               Home Medications    Prior to Admission medications   Medication Sig Start Date End Date Taking? Authorizing Provider  acetaminophen (TYLENOL) 500 MG tablet Take 1,000 mg by mouth every 6 (six) hours as needed for mild pain, moderate pain or fever.    [provider]  cetirizine (ZYRTEC) 10 MG tablet Take 1 tablet (10 mg total) by mouth daily. 11/12/17   Ashley Murrain, NP  cyclobenzaprine (FLEXERIL) 5 MG tablet Take 1 tablet (5 mg total) by mouth 3 (three) times daily as needed. 10/15/17   Rolland Porter, MD  docusate sodium (COLACE) 100 MG capsule  Take 1 capsule (100 mg total) by mouth every 12 (twelve) hours. Patient taking differently: Take 100-200 mg by mouth every 12 (twelve) hours.  01/31/17   Horton, Barbette Hair, MD  HYDROcodone-acetaminophen (NORCO/VICODIN) 5-325 MG tablet Take 2 tablets by mouth every 4 (four) hours as needed for moderate pain. 12/28/17   Noemi Chapel, MD  lisinopril (PRINIVIL,ZESTRIL) 20 MG tablet Take 20 mg by mouth daily.     [provider]  methocarbamol (ROBAXIN) 500 MG tablet Take 1 tablet (500 mg total) by mouth every 8 (eight) hours as needed for muscle spasms. 02/04/17   Jola Schmidt, MD  mometasone (NASONEX) 50 MCG/ACT nasal spray Place 2 sprays into the nose daily. 11/12/17   Ashley Murrain, NP  phenazopyridine (PYRIDIUM) 200 MG tablet Take 1 tablet (200 mg total) by mouth 3 (three) times daily. 12/28/17   Noemi Chapel, MD  polyethylene glycol [facility]) packet Take 17 g by mouth daily. Patient not taking: Reported on 02/01/2017 01/31/17   Horton, Barbette Hair, MD  Vitamin D, Ergocalciferol, (DRISDOL) 50000 units CAPS capsule Take 50,000 Units by mouth every 30 (thirty) days.    [provider]    Family History Family History  Problem Relation Age of Onset  . Colon cancer Brother 69  . Lung cancer Brother   . Colon cancer Father 13  . Colon cancer Brother 39    Social History Social History   Tobacco Use  . Smoking status: Never Smoker  . Smokeless tobacco: Never Used  Substance Use Topics  . Alcohol use: No  . Drug use: No     Allergies   Ketorolac tromethamine; Ivp dye [iodinated diagnostic agents]; Nitrofuran derivatives; Vicodin [hydrocodone-acetaminophen]; and Latex   Review of Systems Review of Systems  All other systems reviewed and are negative.    Physical Exam Updated Vital Signs BP (!) 166/98 (BP Location: Right Arm)   Pulse 80   Temp 98.2 F (36.8 C) (Oral)   Resp 16   SpO2 98%   Physical Exam  Constitutional: She appears well-developed and well-nourished. No distress.  HENT:  Head: Normocephalic and atraumatic.  Mouth/Throat: Oropharynx is clear and moist. No oropharyngeal exudate.  Eyes: Pupils are equal, round, and reactive to light. Conjunctivae and EOM are normal. Right eye exhibits no discharge. Left eye exhibits no discharge. No scleral icterus.  Neck: Normal range of motion. Neck supple. No JVD present. No thyromegaly present.  Cardiovascular: Normal rate, regular rhythm, normal heart sounds and intact distal pulses. Exam reveals no gallop and no friction rub.  No murmur heard. Pulmonary/Chest: Effort normal and breath sounds normal. No respiratory distress. She has no wheezes. She has no rales.  Abdominal: Soft.  Bowel sounds are normal. She exhibits no distension and no mass. There is no tenderness. There is no CVA tenderness.  No CVA tenderness on either side, abdomen is very soft and nontender except for the right lower quadrant where there is a small amount of tenderness.  No guarding or peritoneal signs  Musculoskeletal: Normal range of motion. She exhibits no edema or tenderness.  Lymphadenopathy:    She has no cervical adenopathy.  Neurological: She is alert. Coordination normal.  Skin: Skin is warm and dry. No rash noted. No erythema.  Psychiatric: She has a normal mood and affect. Her behavior is normal.  Nursing note and vitals reviewed.    ED Treatments / Results  Labs (all labs ordered are listed, but only abnormal results are displayed) Labs Reviewed  URINALYSIS, ROUTINE W REFLEX MICROSCOPIC - Abnormal; Notable for the following components:      Result Value   Color, Urine COLORLESS (*)    Specific Gravity, Urine 1.002 (*)    All other components within normal limits  BASIC METABOLIC PANEL - Abnormal; Notable for the following components:   Glucose, Bld 103 (*)    Creatinine, Ser 1.11 (*)    GFR calc non Af Amer 51 (*)    GFR calc Af Amer 59 (*)    All other components within normal limits  URINE CULTURE  CBC WITH DIFFERENTIAL/PLATELET    EKG None  Radiology Ct Abdomen Pelvis Wo Contrast  Result Date: 12/28/2017 CLINICAL DATA:  Right lower quadrant abdominal pain and urinary frequency for 1 day. Clinical concern for appendicitis. The patient's medical record indicates a previous appendectomy. EXAM: CT ABDOMEN AND PELVIS WITHOUT CONTRAST TECHNIQUE: Multidetector CT imaging of the abdomen and pelvis was performed following the standard protocol without IV contrast. COMPARISON:  01/31/2017. FINDINGS: Lower chest: Clear lung bases. Hepatobiliary: No focal liver abnormality is seen. No gallstones, gallbladder wall thickening, or biliary dilatation. Pancreas: Unremarkable. No  pancreatic ductal dilatation or surrounding inflammatory changes. Spleen: Normal in size without focal abnormality. Adrenals/Urinary Tract: Adrenal glands are unremarkable. Kidneys are normal, without renal calculi, focal lesion, or hydronephrosis. Bladder is unremarkable. Stomach/Bowel: Post appendectomy changes. Multiple colonic diverticula without evidence of diverticulitis. Normal appearing stomach and small bowel. Vascular/Lymphatic: Mild atheromatous arterial calcifications. No enlarged lymph nodes. Reproductive: Status post hysterectomy. No adnexal masses. Other: No abdominal wall hernia or abnormality. No abdominopelvic ascites. Musculoskeletal: Mild bilateral hip degenerative changes. Mild lower lumbar scoliosis. Lumbar and lower thoracic spine degenerative changes. IMPRESSION: 1. No acute abnormality. 2. Colonic diverticulosis. 3. Status post appendectomy. Electronically Signed   By: Claudie Revering M.D.   On: 12/28/2017 22:46    Procedures Procedures (including critical care time)  Medications Ordered in ED Medications  ondansetron (ZOFRAN) injection 4 mg (4 mg Intravenous Given 12/28/17 2150)     Initial Impression / Assessment and Plan / ED Course  I have reviewed the triage vital signs and the nursing notes.  Pertinent labs & imaging results that were available during my care of the patient were reviewed by me and considered in my medical decision making (see chart for details).  Clinical Course as of Dec 28 2252  Sun Dec 28, 2017  2250 Urinalysis reveals no signs of hematuria, no signs of infection, there is no signs of leukocytosis or anemia or renal dysfunction of any concern.  The CT scan shows no signs of abnormalities either including kidney stones or abdominal aortic aneurysms.  The patient has ongoing discomfort, she will be given Toradol as well as some to go hydrocodone for the evening.  She is agreeable to the plan.   [BM]    Clinical Course User Index [BM] Noemi Chapel,  MD   The patient's symptoms are consistent with a possible urinary infection, she does not have an appendix and all of her pain is in the right lower quadrant.  Would also consider ovarian cyst or torsion though this seems less likely and the patient is already had a hysterectomy.  Final Clinical Impressions(s) / ED Diagnoses   Final diagnoses:  Right lower quadrant abdominal pain  Dysuria    ED Discharge Orders        Ordered    phenazopyridine (PYRIDIUM) 200 MG tablet  3 times daily     12/28/17 2252  HYDROcodone-acetaminophen (NORCO/VICODIN) 5-325 MG tablet  Every 4 hours PRN     12/28/17 2252       Noemi Chapel, MD 12/28/17 2254

## 2017-12-28 NOTE — ED Notes (Signed)
Pt given 6 Norco pill pack to go

## 2017-12-28 NOTE — ED Triage Notes (Signed)
Pt c/o right lower abd pain and urinary frequency.

## 2017-12-28 NOTE — Discharge Instructions (Signed)
Your xrays were read by the radiologist to show no signs of any abnormalities - NO kidney stones and no surgical findings.  Your blood tesets and urine tests were normal as well. Tylenol for pain Hydrocodone for severe pain (do not take this if you are allergic to it) ER for worsening symptoms.  Please obtain all of your results from medical records or have your doctors office obtain the results - share them with your doctor - you should be seen at your doctors office in the next 2 days. Call today to arrange your follow up. Take the medications as prescribed. Please review all of the medicines and only take them if you do not have an allergy to them. Please be aware that if you are taking birth control pills, taking other prescriptions, ESPECIALLY ANTIBIOTICS may make the birth control ineffective - if this is the case, either do not engage in sexual activity or use alternative methods of birth control such as condoms until you have finished the medicine and your family doctor says it is OK to restart them. If you are on a blood thinner such as COUMADIN, be aware that any other medicine that you take may cause the coumadin to either work too much, or not enough - you should have your coumadin level rechecked in next 7 days if this is the case.  ?  It is also a possibility that you have an allergic reaction to any of the medicines that you have been prescribed - Everybody reacts differently to medications and while MOST people have no trouble with most medicines, you may have a reaction such as nausea, vomiting, rash, swelling, shortness of breath. If this is the case, please stop taking the medicine immediately and contact your physician.  ?  You should return to the ER if you develop severe or worsening symptoms.

## 2017-12-29 MED FILL — Hydrocodone-Acetaminophen Tab 5-325 MG: ORAL | Qty: 6 | Status: AC

## 2017-12-30 LAB — URINE CULTURE: Culture: NO GROWTH

## 2018-04-13 ENCOUNTER — Ambulatory Visit: Payer: Self-pay | Admitting: Orthopedic Surgery

## 2018-04-29 ENCOUNTER — Ambulatory Visit: Payer: Medicare Other | Admitting: Orthopedic Surgery

## 2018-04-29 ENCOUNTER — Encounter: Payer: Self-pay | Admitting: Orthopedic Surgery

## 2018-08-05 ENCOUNTER — Emergency Department (HOSPITAL_COMMUNITY)
Admission: EM | Admit: 2018-08-05 | Discharge: 2018-08-05 | Disposition: A | Discharge status: Home / Self Care | Payer: Medicare Other | Attending: Emergency Medicine | Admitting: Emergency Medicine

## 2018-08-05 ENCOUNTER — Other Ambulatory Visit: Payer: Self-pay

## 2018-08-05 ENCOUNTER — Encounter (HOSPITAL_COMMUNITY): Payer: Self-pay | Admitting: Emergency Medicine

## 2018-08-05 DIAGNOSIS — R221 Localized swelling, mass and lump, neck: Secondary | ICD-10-CM | POA: Insufficient documentation

## 2018-08-05 DIAGNOSIS — Z5321 Procedure and treatment not carried out due to patient leaving prior to being seen by health care provider: Secondary | ICD-10-CM | POA: Diagnosis not present

## 2018-08-05 NOTE — ED Triage Notes (Signed)
Patient states she was taken off lisinopril and placed on amlodipine by PCP. States she took first dose of amlodipine this morning and is feeling like her throat is swelling "a little bit" and having mood swings. NAD noted in triage.

## 2018-08-05 NOTE — ED Notes (Signed)
Notified by registration that patient left.  

## 2019-08-24 ENCOUNTER — Ambulatory Visit: Payer: Medicare Other | Admitting: Orthopaedic Surgery

## 2020-03-15 ENCOUNTER — Encounter: Payer: Self-pay | Admitting: *Deleted

## 2020-05-08 ENCOUNTER — Ambulatory Visit (INDEPENDENT_AMBULATORY_CARE_PROVIDER_SITE_OTHER): Payer: Self-pay | Admitting: *Deleted

## 2020-05-08 ENCOUNTER — Other Ambulatory Visit: Payer: Self-pay

## 2020-05-08 VITALS — Ht 73.0 in | Wt 213.8 lb

## 2020-05-08 DIAGNOSIS — Z86010 Personal history of colonic polyps: Secondary | ICD-10-CM

## 2020-05-08 MED ORDER — PEG 3350-KCL-NA BICARB-NACL 420 G PO SOLR: 4000.0000 mL | Freq: Once | ORAL | 0 refills | Status: AC

## 2020-05-08 NOTE — Progress Notes (Signed)
Gastroenterology Pre-Procedure Review  Request Date: 05/08/2020 Requesting Physician: Dr. Dewayne Shorter @ [facility], Last TCS 2016 done by Dr. Gala Romney, no polyps, 2013- tubular adenoma  PATIENT REVIEW QUESTIONS: The patient responded to the following health history questions as indicated:    1. Diabetes Melitis: no 2. Joint replacements in the past 12 months: no 3. Major health problems in the past 3 months: no 4. Has an artificial valve or MVP: no 5. Has a defibrillator: no 6. Has been advised in past to take antibiotics in advance of a procedure like teeth cleaning: yes, can't remember name of antibiotics 7. Family history of colon cancer: yes, brothers: age 56 and 57 dad: age: 14  8. Alcohol Use: no 9. Illicit drug Use: no 10. History of sleep apnea: no 11. History of coronary artery or other vascular stents placed within the last 12 months: no 12. History of any prior anesthesia complications: no 13. Body mass index is 28.21 kg/m.    MEDICATIONS & ALLERGIES:    Patient reports the following regarding taking any blood thinners:   Plavix? no Aspirin? no Coumadin? no Brilinta? no Xarelto? no Eliquis? no Pradaxa? no Savaysa? no Effient? no  Patient confirms/reports the following medications:  Current Outpatient Medications  Medication Sig Dispense Refill  . acetaminophen (TYLENOL) 500 MG tablet Take 1,000 mg by mouth as needed for mild pain, moderate pain or fever.     Marland Kitchen lisinopril (PRINIVIL,ZESTRIL) 20 MG tablet Take 20 mg by mouth daily.      No current facility-administered medications for this visit.    Patient confirms/reports the following allergies:  Allergies  Allergen Reactions  . Ketorolac Tromethamine Anaphylaxis  . Ivp Dye [Iodinated Diagnostic Agents] Nausea And Vomiting  . Nitrofuran Derivatives Other (See Comments)    Mood swings, altered mental status.  . Vicodin [Hydrocodone-Acetaminophen] Other (See Comments)     Arrhythmias  . Latex Hives    No orders of the defined types were placed in this encounter.   AUTHORIZATION INFORMATION Primary Insurance: Medicare,  ID #:[id number] Pre-Cert / Auth required: No, not required  Secondary Insurance: Medicaid [employer], ID#: [id number] Pre-Cert / Josem Kaufmann required: No, not required  SCHEDULE INFORMATION: Procedure has been scheduled as follows:  Date: 06/21/2020, Time: 1:00 Location: [facility] with Dr. Gala Romney  This Gastroenterology Pre-Precedure Review Form is being routed to the following provider(s): Neil Crouch, PA

## 2020-05-08 NOTE — Patient Instructions (Signed)
[name] [name]   [birth year] MRN: [id number]    Procedure Date: 06/21/2020 Time to register: 12:00 pm Place to register: [facility] Procedure Time: 1:00 pm Scheduled provider: Dr. Gala Romney  PREPARATION FOR COLONOSCOPY WITH TRI-LYTE SPLIT PREP  Please notify us immediately if you are diabetic, take iron supplements, or if you are on Coumadin or any other blood thinners.   Please hold the following medications: n/a  You will need to purchase 1 fleet enema and 1 box of Bisacodyl $RemoveBefo'5mg'gansFtLWdzs$  tablets.   2 DAYS BEFORE PROCEDURE:  DATE: 06/19/2020  DAY: Monday Begin clear liquid diet AFTER your lunch meal. NO SOLID FOODS after this point.  1 DAY BEFORE PROCEDURE:  DATE: 06/20/2020   DAY: Tuesday Continue clear liquids the entire day - NO SOLID FOOD.   Diabetic medications adjustments for today: n/a  At 2:00 pm:  Take 2 Bisacodyl tablets.   At 4:00pm:  Start drinking your solution. Make sure you mix well per instructions on the bottle. Try to drink 1 (one) 8 ounce glass every 10-15 minutes until you have consumed HALF the jug. You should complete by 6:00pm.You must keep the left over solution refrigerated until completed next day.  Continue clear liquids. You must drink plenty of clear liquids to prevent dehyration and kidney failure.     DAY OF PROCEDURE:   DATE: 06/21/2020   DAY: Wednesday If you take medications for your heart, blood pressure or breathing, you may take these medications.  Diabetic medications adjustments for today: n/a  Five hours before your procedure time @ 8:00 am:  Finish remaining amout of bowel prep, drinking 1 (one) 8 ounce glass every 10-15 minutes until complete. You have two hours to consume remaining prep.   Three hours before your procedure time @ 10:00 am:  Nothing by mouth.   At least one hour before going to the hospital:  Give yourself one Fleet enema. You may take your morning medications with sip of water unless we have instructed otherwise.       Please see below for Dietary Information.  CLEAR LIQUIDS INCLUDE:  Water Jello (NOT red in color)   Ice Popsicles (NOT red in color)   Tea (sugar ok, no milk/cream) Powdered fruit flavored drinks  Coffee (sugar ok, no milk/cream) Gatorade/ Lemonade/ Kool-Aid  (NOT red in color)   Juice: apple, white grape, white cranberry Soft drinks  Clear bullion, consomme, broth (fat free beef/chicken/vegetable)  Carbonated beverages (any kind)  Strained chicken noodle soup Hard Candy   Remember: Clear liquids are liquids that will allow you to see your fingers on the other side of a clear glass. Be sure liquids are NOT red in color, and not cloudy, but CLEAR.  DO NOT EAT OR DRINK ANY OF THE FOLLOWING:  Dairy products of any kind   Cranberry juice Tomato juice / V8 juice   Grapefruit juice Orange juice     Red grape juice  Do not eat any solid foods, including such foods as: cereal, oatmeal, yogurt, fruits, vegetables, creamed soups, eggs, bread, crackers, pureed foods in a blender, etc.   HELPFUL HINTS FOR DRINKING PREP SOLUTION:   Make sure prep is extremely cold. Mix and refrigerate the the morning of the prep. You may also put in the freezer.   You may try mixing some Crystal Light or Country Time Lemonade if you prefer. Mix in small amounts; add more if necessary.  Try drinking through a straw  Rinse mouth with water or  a mouthwash between glasses, to remove after-taste.  Try sipping on a cold beverage /ice/ popsicles between glasses of prep.  Place a piece of sugar-free hard candy in mouth between glasses.  If you become nauseated, try consuming smaller amounts, or stretch out the time between glasses. Stop for 30-60 minutes, then slowly start back drinking.        OTHER INSTRUCTIONS  You will need a responsible adult at least 67 years of age to accompany you and drive you home. This person must remain in the waiting room during your procedure. The hospital will cancel  your procedure if you do not have a responsible adult with you.   1. Wear loose fitting clothing that is easily removed. 2. Leave jewelry and other valuables at home.  3. Remove all body piercing jewelry and leave at home. 4. Total time from sign-in until discharge is approximately 2-3 hours. 5. You should go home directly after your procedure and rest. You can resume normal activities the day after your procedure. 6. The day of your procedure you should not:  Drive  Make legal decisions  Operate machinery  Drink alcohol  Return to work   You may call the office (Dept: [phone]) before 5:00pm, or page the doctor on call [phone]) after 5:00pm, for further instructions, if necessary.   Insurance Information YOU WILL NEED TO CHECK WITH YOUR INSURANCE COMPANY FOR THE BENEFITS OF COVERAGE YOU HAVE FOR THIS PROCEDURE.  UNFORTUNATELY, NOT ALL INSURANCE COMPANIES HAVE BENEFITS TO COVER ALL OR PART OF THESE TYPES OF PROCEDURES.  IT IS YOUR RESPONSIBILITY TO CHECK YOUR BENEFITS, HOWEVER, WE WILL BE GLAD TO ASSIST YOU WITH ANY CODES YOUR INSURANCE COMPANY MAY NEED.    PLEASE NOTE THAT MOST INSURANCE COMPANIES WILL NOT COVER A SCREENING COLONOSCOPY FOR PEOPLE UNDER THE AGE OF 50  IF YOU HAVE BCBS INSURANCE, YOU MAY HAVE BENEFITS FOR A SCREENING COLONOSCOPY BUT IF POLYPS ARE FOUND THE DIAGNOSIS WILL CHANGE AND THEN YOU MAY HAVE A DEDUCTIBLE THAT WILL NEED TO BE MET. SO PLEASE MAKE SURE YOU CHECK YOUR BENEFITS FOR A SCREENING COLONOSCOPY AS WELL AS A DIAGNOSTIC COLONOSCOPY.

## 2020-05-10 NOTE — Progress Notes (Signed)
Ok to schedule conscious sedation. ASA II.  °

## 2020-05-22 ENCOUNTER — Encounter: Payer: Self-pay | Admitting: Internal Medicine

## 2020-06-19 ENCOUNTER — Other Ambulatory Visit (HOSPITAL_COMMUNITY)
Admission: RE | Admit: 2020-06-19 | Discharge: 2020-06-19 | Disposition: A | Discharge status: Home / Self Care | Payer: Medicare Other | Source: Ambulatory Visit | Attending: Internal Medicine | Admitting: Internal Medicine

## 2020-06-19 ENCOUNTER — Other Ambulatory Visit: Payer: Self-pay

## 2020-06-19 DIAGNOSIS — Z20822 Contact with and (suspected) exposure to covid-19: Secondary | ICD-10-CM | POA: Diagnosis not present

## 2020-06-19 DIAGNOSIS — Z01812 Encounter for preprocedural laboratory examination: Secondary | ICD-10-CM | POA: Diagnosis present

## 2020-06-20 LAB — SARS CORONAVIRUS 2 (TAT 6-24 HRS): SARS Coronavirus 2: NEGATIVE

## 2020-06-21 ENCOUNTER — Encounter (HOSPITAL_COMMUNITY): Payer: Self-pay | Admitting: Internal Medicine

## 2020-06-21 ENCOUNTER — Other Ambulatory Visit: Payer: Self-pay

## 2020-06-21 ENCOUNTER — Ambulatory Visit (HOSPITAL_COMMUNITY)
Admission: RE | Admit: 2020-06-21 | Discharge: 2020-06-21 | Disposition: A | Discharge status: Home / Self Care | Payer: Medicare Other | Attending: Internal Medicine | Admitting: Internal Medicine

## 2020-06-21 ENCOUNTER — Encounter (HOSPITAL_COMMUNITY)
Admission: RE | Disposition: A | Discharge status: Home / Self Care | Payer: Self-pay | Source: Home / Self Care | Attending: Internal Medicine

## 2020-06-21 DIAGNOSIS — K5730 Diverticulosis of large intestine without perforation or abscess without bleeding: Secondary | ICD-10-CM | POA: Diagnosis not present

## 2020-06-21 DIAGNOSIS — Z96659 Presence of unspecified artificial knee joint: Secondary | ICD-10-CM | POA: Diagnosis not present

## 2020-06-21 DIAGNOSIS — K635 Polyp of colon: Secondary | ICD-10-CM

## 2020-06-21 DIAGNOSIS — Z1211 Encounter for screening for malignant neoplasm of colon: Secondary | ICD-10-CM | POA: Diagnosis not present

## 2020-06-21 DIAGNOSIS — Z86010 Personal history of colonic polyps: Secondary | ICD-10-CM | POA: Diagnosis not present

## 2020-06-21 DIAGNOSIS — D124 Benign neoplasm of descending colon: Secondary | ICD-10-CM | POA: Diagnosis not present

## 2020-06-21 DIAGNOSIS — Z9089 Acquired absence of other organs: Secondary | ICD-10-CM | POA: Insufficient documentation

## 2020-06-21 DIAGNOSIS — Z79899 Other long term (current) drug therapy: Secondary | ICD-10-CM | POA: Insufficient documentation

## 2020-06-21 HISTORY — PX: POLYPECTOMY: SHX5525

## 2020-06-21 HISTORY — PX: COLONOSCOPY: SHX5424

## 2020-06-21 SURGERY — COLONOSCOPY
Anesthesia: Moderate Sedation

## 2020-06-21 MED ORDER — MEPERIDINE HCL 100 MG/ML IJ SOLN
INTRAMUSCULAR | Status: DC | PRN
  Administered 2020-06-21: 15 mg via INTRAVENOUS
  Administered 2020-06-21: 25 mg via INTRAVENOUS

## 2020-06-21 MED ORDER — MIDAZOLAM HCL 5 MG/5ML IJ SOLN
INTRAMUSCULAR | Status: DC | PRN
  Administered 2020-06-21: 2 mg via INTRAVENOUS
  Administered 2020-06-21 (×3): 1 mg via INTRAVENOUS

## 2020-06-21 MED ORDER — ONDANSETRON HCL 4 MG/2ML IJ SOLN
INTRAMUSCULAR | Status: AC
  Filled 2020-06-21: qty 2

## 2020-06-21 MED ORDER — STERILE WATER FOR IRRIGATION IR SOLN
Status: DC | PRN
  Administered 2020-06-21: 500 mL

## 2020-06-21 MED ORDER — SODIUM CHLORIDE 0.9 % IV SOLN
INTRAVENOUS | Status: DC
  Administered 2020-06-21: 1000 mL via INTRAVENOUS

## 2020-06-21 MED ORDER — MIDAZOLAM HCL 5 MG/5ML IJ SOLN
INTRAMUSCULAR | Status: AC
  Filled 2020-06-21: qty 10

## 2020-06-21 MED ORDER — ONDANSETRON HCL 4 MG/2ML IJ SOLN
INTRAMUSCULAR | Status: DC | PRN
  Administered 2020-06-21: 4 mg via INTRAVENOUS

## 2020-06-21 MED ORDER — MEPERIDINE HCL 50 MG/ML IJ SOLN
INTRAMUSCULAR | Status: AC
  Filled 2020-06-21: qty 1

## 2020-06-21 NOTE — H&P (Signed)
@LOGO @   Primary Care Physician:  Dewayne Shorter, MD Primary Gastroenterologist:  Dr. Gala Romney  Pre-Procedure History & Physical: HPI:  [name] [name] is a 69 y.o. female here for surveillance colonoscopy.  History colonic adenoma.  No bowel symptoms currently.  Past Medical History:  Diagnosis Date  . Adenomatous colon polyp 2007  . Chronic renal insufficiency   . DDD (degenerative disc disease), lumbar   . Depression   . Diverticulosis 2010  . Glucose intolerance (impaired glucose tolerance)   . Hyperlipidemia   . Hypertension   . Tubular adenoma of colon 11/2011    Past Surgical History:  Procedure Laterality Date  . APPENDECTOMY    . COLONOSCOPY  08/02/2005   Zhamir Pirro-Internal hemorrhoids, otherwise normal rectum. adenoma  . COLONOSCOPY  11/14/2011   RMR: Friable anal rectum-likely source of hematochezia Early sigmoid diverticular changes. Colonic tubular adenoma removed   . COLONOSCOPY N/A 05/17/2015   RMR: Internal hemorrhoids likely source of hematochezia in the setting of significant constipation. Colonic diverticulosis.   . Ileocolonoscopy  08/22/2008   Jolanta Cabeza- Friable anal canal, otherwise normal-appearing rectal mucosa, early shallow left-sided diverticula, colonic mucosa, and  terminal ileal mucosa appeared normal.   . REPLACEMENT TOTAL KNEE  2002    Prior to Admission medications   Medication Sig Start Date End Date Taking? Authorizing Provider  lisinopril (PRINIVIL,ZESTRIL) 20 MG tablet Take 20 mg by mouth daily.   Yes [provider]  diclofenac Sodium (VOLTAREN) 1 % GEL Apply 1 application topically 3 (three) times daily as needed for pain. 05/05/20   [provider]  Polyethyl Glycol-Propyl Glycol (LUBRICANT EYE DROPS) 0.4-0.3 % SOLN Place 1 drop into both eyes 3 (three) times daily as needed (dry/irritated eyes.).    [provider]  triamcinolone (KENALOG) 0.025 % cream Apply 1 application topically 2 (two) times daily as needed (skin rash).   05/05/20   [provider]    Allergies as of 05/11/2020 - Review Complete 05/08/2020  Allergen Reaction Noted  . Ketorolac tromethamine Anaphylaxis   . Ivp dye [iodinated diagnostic agents] Nausea And Vomiting 02/01/2017  . Nitrofuran derivatives Other (See Comments) 04/24/2016  . Vicodin [hydrocodone-acetaminophen] Other (See Comments) 09/23/2015  . Latex Hives     Family History  Problem Relation Age of Onset  . Colon cancer Brother 19  . Lung cancer Brother   . Colon cancer Father 32  . Colon cancer Brother 68    Social History   Socioeconomic History  . Marital status: Divorced    Spouse name: Not on file  . Number of children: Not on file  . Years of education: Not on file  . Highest education level: Not on file  Occupational History  . Occupation: disabled, [place]: UNEMPLOYED  Tobacco Use  . Smoking status: Never Smoker  . Smokeless tobacco: Never Used  Vaping Use  . Vaping Use: Never used  Substance and Sexual Activity  . Alcohol use: No  . Drug use: No  . Sexual activity: Not Currently    Partners: Male    Birth control/protection: None    Comment: boyfriend  Other Topics Concern  . Not on file  Social History Narrative  . Not on file   Social Determinants of Health   Financial Resource Strain: Not on file  Food Insecurity: Not on file  Transportation Needs: Not on file  Physical Activity: Not on file  Stress: Not on file  Social Connections: Not on file  Intimate  Partner Violence: Not on file    Review of Systems: See HPI, otherwise negative ROS  Physical Exam: BP (!) 165/89   Pulse (!) 54   Temp 98.3 F (36.8 C) (Oral)   Resp 17   Ht 5\' 6"  (1.676 m)   Wt 93 kg   SpO2 99%   BMI 33.09 kg/m  General:   Alert,  Well-developed, well-nourished, pleasant and cooperative in NAD Mouth:  No deformity or lesions. Neck:  Supple; no masses or thyromegaly. No significant cervical adenopathy. Lungs:  Clear throughout to  auscultation.   No wheezes, crackles, or rhonchi. No acute distress. Heart:  Regular rate and rhythm; no murmurs, clicks, rubs,  or gallops. Abdomen: Non-distended, normal bowel sounds.  Soft and nontender without appreciable mass or hepatosplenomegaly.  Impression/Plan: 69 year old lady with history colonic adenoma.  Here for surveillance colonoscopy per plan. The risks, benefits, limitations, alternatives and imponderables have been reviewed with the patient. Questions have been answered. All parties are agreeable.      Notice: This dictation was prepared with Dragon dictation along with smaller phrase technology. Any transcriptional errors that result from this process are unintentional and may not be corrected upon review.

## 2020-06-21 NOTE — Op Note (Signed)
[facility] Patient Name: [name] [name] Procedure Date: 06/21/2020 7:11 AM MRN: [id number] Date of Birth: 12/25/1950 Attending MD: Norvel Richards , MD CSN: [id number] Age: 69 Admit Type: Outpatient Procedure:                Colonoscopy Indications:              High risk colon cancer surveillance: Personal                            history of colonic polyps Providers:                Norvel Richards, MD, Lurline Del, RN, Raphael Gibney, Technician Referring MD:              Medicines:                Midazolam 5 mg IV, Meperidine 40 mg IV Complications:            No immediate complications. Estimated Blood Loss:     Estimated blood loss was minimal. Procedure:                Pre-Anesthesia Assessment:                           - Prior to the procedure, a History and Physical                            was performed, and patient medications and                            allergies were reviewed. The patient's tolerance of                            previous anesthesia was also reviewed. The risks                            and benefits of the procedure and the sedation                            options and risks were discussed with the patient.                            All questions were answered, and informed consent                            was obtained. Prior Anticoagulants: The patient has                            taken no previous anticoagulant or antiplatelet                            agents. ASA Grade Assessment: II - A patient with  mild systemic disease. After reviewing the risks                            and benefits, the patient was deemed in                            satisfactory condition to undergo the procedure.                           After obtaining informed consent, the colonoscope                            was passed under direct vision. Throughout the                            procedure,  the patient's blood pressure, pulse, and                            oxygen saturations were monitored continuously. The                            CF-[id number] ([id number]) scope was introduced through                            the anus and advanced to the the cecum, identified                            by appendiceal orifice and ileocecal valve. The                            colonoscopy was performed without difficulty. The                            patient tolerated the procedure well. The ileocecal                            valve, appendiceal orifice, and rectum were                            photographed. The entire colon was well visualized. Scope In: 7:51:37 AM Scope Out: 8:03:43 AM Scope Withdrawal Time: 0 hours 8 minutes 47 seconds  Total Procedure Duration: 0 hours 12 minutes 6 seconds  Findings:      The perianal and digital rectal examinations were normal.      Scattered medium-mouthed diverticula were found in the sigmoid colon and       descending colon.      Three sessile polyps were found in the descending colon and ileocecal       valve. The polyps were 3 to 5 mm in size. These polyps were removed with       a cold snare. Resection and retrieval were complete. Estimated blood       loss was minimal.      The exam was otherwise without abnormality on direct and retroflexion       views. Impression:               -  Diverticulosis in the sigmoid colon and in the                            descending colon.                           - Three 3 to 5 mm polyps in the descending colon                            and at the ileocecal valve, removed with a cold                            snare. Resected and retrieved.                           - The examination was otherwise normal on direct                            and retroflexion views. Moderate Sedation:      Moderate (conscious) sedation was administered by the endoscopy nurse       and supervised by the endoscopist. The  following parameters were       monitored: oxygen saturation, heart rate, blood pressure, respiratory       rate, EKG, adequacy of pulmonary ventilation, and response to care.       Total physician intraservice time was 17 minutes. Recommendation:           - Patient has a contact number available for                            emergencies. The signs and symptoms of potential                            delayed complications were discussed with the                            patient. Return to normal activities tomorrow.                            Written discharge instructions were provided to the                            patient.                           - Advance diet as tolerated.                           - Continue present medications.                           - Repeat colonoscopy date to be determined after                            pending pathology results are reviewed for  surveillance based on pathology results.                           - Return to GI office (date not yet determined). Procedure Code(s):        --- Professional ---                           [phone], Colonoscopy, flexible; with removal of                            tumor(s), polyp(s), or other lesion(s) by snare                            technique                           G0500, Moderate sedation services provided by the                            same physician or other qualified health care                            professional performing a gastrointestinal                            endoscopic service that sedation supports,                            requiring the presence of an independent trained                            observer to assist in the monitoring of the                            patient's level of consciousness and physiological                            status; initial 15 minutes of intra-service time;                            patient age 22 years or older  (additional time may                            be reported with [id number], as appropriate) Diagnosis Code(s):        --- Professional ---                           Z86.010, Personal history of colonic polyps                           K63.5, Polyp of colon                           K57.30, Diverticulosis of large intestine without  perforation or abscess without bleeding CPT copyright 2019 American Medical Association. All rights reserved. The codes documented in this report are preliminary and upon coder review may  be revised to meet current compliance requirements. Cristopher Estimable. Taytem Ghattas, MD Norvel Richards, MD 06/21/2020 8:50:07 AM This report has been signed electronically. Number of Addenda: 0

## 2020-06-21 NOTE — Discharge Instructions (Signed)
Colonoscopy Discharge Instructions  Read the instructions outlined below and refer to this sheet in the next few weeks. These discharge instructions provide you with general information on caring for yourself after you leave the hospital. Your doctor may also give you specific instructions. While your treatment has been planned according to the most current medical practices available, unavoidable complications occasionally occur. If you have any problems or questions after discharge, call Dr. Gala Romney at [phone]. ACTIVITY  You may resume your regular activity, but move at a slower pace for the next 24 hours.   Take frequent rest periods for the next 24 hours.   Walking will help get rid of the air and reduce the bloated feeling in your belly (abdomen).   No driving for 24 hours (because of the medicine (anesthesia) used during the test).    Do not sign any important legal documents or operate any machinery for 24 hours (because of the anesthesia used during the test).  NUTRITION  Drink plenty of fluids.   You may resume your normal diet as instructed by your doctor.   Begin with a light meal and progress to your normal diet. Heavy or fried foods are harder to digest and may make you feel sick to your stomach (nauseated).   Avoid alcoholic beverages for 24 hours or as instructed.  MEDICATIONS  You may resume your normal medications unless your doctor tells you otherwise.  WHAT YOU CAN EXPECT TODAY  Some feelings of bloating in the abdomen.   Passage of more gas than usual.   Spotting of blood in your stool or on the toilet paper.  IF YOU HAD POLYPS REMOVED DURING THE COLONOSCOPY:  No aspirin products for 7 days or as instructed.   No alcohol for 7 days or as instructed.   Eat a soft diet for the next 24 hours.  FINDING OUT THE RESULTS OF YOUR TEST Not all test results are available during your visit. If your test results are not back during the visit, make an appointment  with your caregiver to find out the results. Do not assume everything is normal if you have not heard from your caregiver or the medical facility. It is important for you to follow up on all of your test results.  SEEK IMMEDIATE MEDICAL ATTENTION IF:  You have more than a spotting of blood in your stool.   Your belly is swollen (abdominal distention).   You are nauseated or vomiting.   You have a temperature over 101.   You have abdominal pain or discomfort that is severe or gets worse throughout the day.   3 polyps removed from your colon today  Colon polyp and diverticulosis information provided  Further recommendations to follow pending review of pathology report  At patient request, I called Ebony Hail at [phone]   Colon Polyps  Polyps are tissue growths inside the body. Polyps can grow in many places, including the large intestine (colon). A polyp may be a round bump or a mushroom-shaped growth. You could have one polyp or several. Most colon polyps are noncancerous (benign). However, some colon polyps can become cancerous over time. Finding and removing the polyps early can help prevent this. What are the causes? The exact cause of colon polyps is not known. What increases the risk? You are more likely to develop this condition if you:  Have a family history of colon cancer or colon polyps.  Are older than 61 or older than 45 if you are African American.  Have inflammatory bowel disease, such as ulcerative colitis or Crohn's disease.  Have certain hereditary conditions, such as: ? Familial adenomatous polyposis. ? Lynch syndrome. ? Turcot syndrome. ? Peutz-Jeghers syndrome.  Are overweight.  Smoke cigarettes.  Do not get enough exercise.  Drink too much alcohol.  Eat a diet that is high in fat and red meat and low in fiber.  Had childhood cancer that was treated with abdominal radiation. What are the signs or symptoms? Most polyps do not cause  symptoms. If you have symptoms, they may include:  Blood coming from your rectum when having a bowel movement.  Blood in your stool. The stool may look dark red or black.  Abdominal pain.  A change in bowel habits, such as constipation or diarrhea. How is this diagnosed? This condition is diagnosed with a colonoscopy. This is a procedure in which a lighted, flexible scope is inserted into the anus and then passed into the colon to examine the area. Polyps are sometimes found when a colonoscopy is done as part of routine cancer screening tests. How is this treated? Treatment for this condition involves removing any polyps that are found. Most polyps can be removed during a colonoscopy. Those polyps will then be tested for cancer. Additional treatment may be needed depending on the results of testing. Follow these instructions at home: Lifestyle  Maintain a healthy weight, or lose weight if recommended by your health care provider.  Exercise every day or as told by your health care provider.  Do not use any products that contain nicotine or tobacco, such as cigarettes and e-cigarettes. If you need help quitting, ask your health care provider.  If you drink alcohol, limit how much you have: ? 0-1 drink a day for women. ? 0-2 drinks a day for men.  Be aware of how much alcohol is in your drink. In the [place] one drink equals one 12 oz bottle of beer (355 mL), one 5 oz glass of wine (148 mL), or one 1 oz shot of hard liquor (44 mL). Eating and drinking   Eat foods that are high in fiber, such as fruits, vegetables, and whole grains.  Eat foods that are high in calcium and vitamin D, such as milk, cheese, yogurt, eggs, liver, fish, and broccoli.  Limit foods that are high in fat, such as fried foods and desserts.  Limit the amount of red meat and processed meat you eat, such as hot dogs, sausage, bacon, and lunch meats. General instructions  Keep all follow-up visits as told by your  health care provider. This is important. ? This includes having regularly scheduled colonoscopies. ? Talk to your health care provider about when you need a colonoscopy. Contact a health care provider if:  You have new or worsening bleeding during a bowel movement.  You have new or increased blood in your stool.  You have a change in bowel habits.  You lose weight for no known reason. Summary  Polyps are tissue growths inside the body. Polyps can grow in many places, including the colon.  Most colon polyps are noncancerous (benign), but some can become cancerous over time.  This condition is diagnosed with a colonoscopy.  Treatment for this condition involves removing any polyps that are found. Most polyps can be removed during a colonoscopy. This information is not intended to replace advice given to you by your health care provider. Make sure you discuss any questions you have with your health care provider. Document Revised: 10/09/2017  Document Reviewed: 10/09/2017 Elsevier Patient Education  [employer].  Diverticulosis  Diverticulosis is a condition that develops when small pouches (diverticula) form in the wall of the large intestine (colon). The colon is where water is absorbed and stool (feces) is formed. The pouches form when the inside layer of the colon pushes through weak spots in the outer layers of the colon. You may have a few pouches or many of them. The pouches usually do not cause problems unless they become inflamed or infected. When this happens, the condition is called diverticulitis. What are the causes? The cause of this condition is not known. What increases the risk? The following factors may make you more likely to develop this condition:  Being older than age 15. Your risk for this condition increases with age. Diverticulosis is rare among people younger than age 25. By age 55, many people have it.  Eating a low-fiber diet.  Having frequent  constipation.  Being overweight.  Not getting enough exercise.  Smoking.  Taking over-the-counter pain medicines, like aspirin and ibuprofen.  Having a family history of diverticulosis. What are the signs or symptoms? In most people, there are no symptoms of this condition. If you do have symptoms, they may include:  Bloating.  Cramps in the abdomen.  Constipation or diarrhea.  Pain in the lower left side of the abdomen. How is this diagnosed? Because diverticulosis usually has no symptoms, it is most often diagnosed during an exam for other colon problems. The condition may be diagnosed by:  Using a flexible scope to examine the colon (colonoscopy).  Taking an X-ray of the colon after dye has been put into the colon (barium enema).  Having a CT scan. How is this treated? You may not need treatment for this condition. Your health care provider may recommend treatment to prevent problems. You may need treatment if you have symptoms or if you previously had diverticulitis. Treatment may include:  Eating a high-fiber diet.  Taking a fiber supplement.  Taking a live bacteria supplement (probiotic).  Taking medicine to relax your colon. Follow these instructions at home: Medicines  Take over-the-counter and prescription medicines only as told by your health care provider.  If told by your health care provider, take a fiber supplement or probiotic. Constipation prevention Your condition may cause constipation. To prevent or treat constipation, you may need to:  Drink enough fluid to keep your urine pale yellow.  Take over-the-counter or prescription medicines.  Eat foods that are high in fiber, such as beans, whole grains, and fresh fruits and vegetables.  Limit foods that are high in fat and processed sugars, such as fried or sweet foods.  General instructions  Try not to strain when you have a bowel movement.  Keep all follow-up visits as told by your health  care provider. This is important. Contact a health care provider if you:  Have pain in your abdomen.  Have bloating.  Have cramps.  Have not had a bowel movement in 3 days. Get help right away if:  Your pain gets worse.  Your bloating becomes very bad.  You have a fever or chills, and your symptoms suddenly get worse.  You vomit.  You have bowel movements that are bloody or black.  You have bleeding from your rectum. Summary  Diverticulosis is a condition that develops when small pouches (diverticula) form in the wall of the large intestine (colon).  You may have a few pouches or many of them.  This condition is most often diagnosed during an exam for other colon problems.  Treatment may include increasing the fiber in your diet, taking supplements, or taking medicines. This information is not intended to replace advice given to you by your health care provider. Make sure you discuss any questions you have with your health care provider. Document Revised: 01/21/2019 Document Reviewed: 01/21/2019 Elsevier Patient Education  [place].

## 2020-06-22 ENCOUNTER — Telehealth: Payer: Self-pay | Admitting: Internal Medicine

## 2020-06-22 ENCOUNTER — Encounter: Payer: Self-pay | Admitting: Internal Medicine

## 2020-06-22 LAB — SURGICAL PATHOLOGY

## 2020-06-22 NOTE — Telephone Encounter (Signed)
Pt had procedure yesterday by Dr Gala Romney and said she needs a work excuse because she does a lot of pushing and pulling on her job. Please advise/ [phone]

## 2020-06-22 NOTE — Telephone Encounter (Signed)
Spoke with pt. Pt had TCS on yesterday 06/21/20. Pt's job requires a lot of lifting and pt would like to return to work on Monday. Please advise if it's ok to provide note.

## 2020-06-22 NOTE — Telephone Encounter (Signed)
Okay 

## 2020-06-22 NOTE — Telephone Encounter (Signed)
Noted. Letter is ready for pickup. Pt is aware.

## 2020-06-28 ENCOUNTER — Encounter (HOSPITAL_COMMUNITY): Payer: Self-pay | Admitting: Internal Medicine

## 2022-02-13 ENCOUNTER — Other Ambulatory Visit: Payer: Self-pay | Admitting: Family Medicine

## 2022-02-13 ENCOUNTER — Other Ambulatory Visit (HOSPITAL_COMMUNITY): Payer: Self-pay | Admitting: Family Medicine

## 2022-02-13 ENCOUNTER — Ambulatory Visit (HOSPITAL_COMMUNITY)
Admission: RE | Admit: 2022-02-13 | Discharge: 2022-02-13 | Disposition: A | Discharge status: Home / Self Care | Payer: Medicare Other | Source: Ambulatory Visit | Attending: Family Medicine | Admitting: Family Medicine

## 2022-02-13 DIAGNOSIS — H53121 Transient visual loss, right eye: Secondary | ICD-10-CM

## 2022-02-13 DIAGNOSIS — G453 Amaurosis fugax: Secondary | ICD-10-CM

## 2022-02-13 LAB — POCT I-STAT CREATININE: Creatinine, Ser: 1.2 mg/dL — ABNORMAL HIGH (ref 0.44–1.00)

## 2022-02-13 MED ORDER — IOHEXOL 350 MG/ML SOLN
75.0000 mL | Freq: Once | INTRAVENOUS | Status: AC | PRN
  Administered 2022-02-13: 75 mL via INTRAVENOUS

## 2022-02-13 MED ORDER — GADOBUTROL 1 MMOL/ML IV SOLN
10.0000 mL | Freq: Once | INTRAVENOUS | Status: AC | PRN
  Administered 2022-02-13: 10 mL via INTRAVENOUS

## 2022-02-21 ENCOUNTER — Ambulatory Visit (HOSPITAL_COMMUNITY)
Admission: RE | Admit: 2022-02-21 | Discharge: 2022-02-21 | Disposition: A | Discharge status: Home / Self Care | Payer: Medicare Other | Source: Ambulatory Visit | Attending: Family Medicine | Admitting: Family Medicine

## 2022-02-21 DIAGNOSIS — H53121 Transient visual loss, right eye: Secondary | ICD-10-CM

## 2022-02-21 DIAGNOSIS — G453 Amaurosis fugax: Secondary | ICD-10-CM

## 2022-02-21 LAB — ECHOCARDIOGRAM COMPLETE
AR max vel: 2.38 cm2
AV Area VTI: 2.55 cm2
AV Area mean vel: 2.43 cm2
AV Mean grad: 4 mmHg
AV Peak grad: 9.6 mmHg
Ao pk vel: 1.55 m/s
Area-P 1/2: 3.15 cm2
Calc EF: 51 %
MV VTI: 2.49 cm2
S' Lateral: 2.8 cm
Single Plane A2C EF: 47.3 %
Single Plane A4C EF: 54.8 %

## 2023-01-24 ENCOUNTER — Ambulatory Visit: Payer: 59 | Admitting: Gastroenterology

## 2023-01-27 ENCOUNTER — Encounter: Payer: Self-pay | Admitting: Gastroenterology

## 2023-03-18 ENCOUNTER — Emergency Department (HOSPITAL_COMMUNITY)
Admission: EM | Admit: 2023-03-18 | Discharge: 2023-03-18 | Disposition: A | Discharge status: Home / Self Care | Payer: 59 | Attending: Emergency Medicine | Admitting: Emergency Medicine

## 2023-03-18 ENCOUNTER — Encounter (HOSPITAL_COMMUNITY): Payer: Self-pay | Admitting: Emergency Medicine

## 2023-03-18 ENCOUNTER — Other Ambulatory Visit: Payer: Self-pay

## 2023-03-18 DIAGNOSIS — K625 Hemorrhage of anus and rectum: Secondary | ICD-10-CM

## 2023-03-18 DIAGNOSIS — K648 Other hemorrhoids: Secondary | ICD-10-CM | POA: Diagnosis not present

## 2023-03-18 DIAGNOSIS — Z91040 Latex allergy status: Secondary | ICD-10-CM | POA: Insufficient documentation

## 2023-03-18 DIAGNOSIS — Z79899 Other long term (current) drug therapy: Secondary | ICD-10-CM | POA: Insufficient documentation

## 2023-03-18 DIAGNOSIS — I10 Essential (primary) hypertension: Secondary | ICD-10-CM | POA: Insufficient documentation

## 2023-03-18 LAB — LACTIC ACID, PLASMA: Lactic Acid, Venous: 0.8 mmol/L (ref 0.5–1.9)

## 2023-03-18 LAB — LIPASE, BLOOD: Lipase: 27 U/L (ref 11–51)

## 2023-03-18 LAB — CBC WITH DIFFERENTIAL/PLATELET
Abs Immature Granulocytes: 0.02 10*3/uL (ref 0.00–0.07)
Basophils Absolute: 0 10*3/uL (ref 0.0–0.1)
Basophils Relative: 1 %
Eosinophils Absolute: 0.1 10*3/uL (ref 0.0–0.5)
Eosinophils Relative: 2 %
HCT: 42.5 % (ref 36.0–46.0)
Hemoglobin: 13.9 g/dL (ref 12.0–15.0)
Immature Granulocytes: 0 %
Lymphocytes Relative: 29 %
Lymphs Abs: 1.8 10*3/uL (ref 0.7–4.0)
MCH: 31.2 pg (ref 26.0–34.0)
MCHC: 32.7 g/dL (ref 30.0–36.0)
MCV: 95.3 fL (ref 80.0–100.0)
Monocytes Absolute: 0.3 10*3/uL (ref 0.1–1.0)
Monocytes Relative: 6 %
Neutro Abs: 3.8 10*3/uL (ref 1.7–7.7)
Neutrophils Relative %: 62 %
Platelets: 207 10*3/uL (ref 150–400)
RBC: 4.46 MIL/uL (ref 3.87–5.11)
RDW: 13.6 % (ref 11.5–15.5)
WBC: 6.1 10*3/uL (ref 4.0–10.5)
nRBC: 0 % (ref 0.0–0.2)

## 2023-03-18 LAB — URINALYSIS, ROUTINE W REFLEX MICROSCOPIC
Bacteria, UA: NONE SEEN
Bilirubin Urine: NEGATIVE
Glucose, UA: NEGATIVE mg/dL
Hgb urine dipstick: NEGATIVE
Ketones, ur: 5 mg/dL — AB
Leukocytes,Ua: NEGATIVE
Nitrite: NEGATIVE
Protein, ur: NEGATIVE mg/dL
Specific Gravity, Urine: 1.009 (ref 1.005–1.030)
pH: 6 (ref 5.0–8.0)

## 2023-03-18 LAB — COMPREHENSIVE METABOLIC PANEL
ALT: 29 U/L (ref 0–44)
AST: 26 U/L (ref 15–41)
Albumin: 4.5 g/dL (ref 3.5–5.0)
Alkaline Phosphatase: 118 U/L (ref 38–126)
Anion gap: 7 (ref 5–15)
BUN: 19 mg/dL (ref 8–23)
CO2: 26 mmol/L (ref 22–32)
Calcium: 9.1 mg/dL (ref 8.9–10.3)
Chloride: 103 mmol/L (ref 98–111)
Creatinine, Ser: 0.96 mg/dL (ref 0.44–1.00)
GFR, Estimated: >60 mL/min (ref >60)
Glucose, Bld: 91 mg/dL (ref 70–99)
Potassium: 3.9 mmol/L (ref 3.5–5.1)
Sodium: 136 mmol/L (ref 135–145)
Total Bilirubin: 0.2 mg/dL — ABNORMAL LOW (ref 0.3–1.2)
Total Protein: 7.9 g/dL (ref 6.5–8.1)

## 2023-03-18 LAB — MAGNESIUM: Magnesium: 2.1 mg/dL (ref 1.7–2.4)

## 2023-03-18 LAB — TYPE AND SCREEN
ABO/RH(D): O POS
Antibody Screen: NEGATIVE

## 2023-03-18 LAB — I-STAT CHEM 8, ED
BUN: 20 mg/dL (ref 8–23)
Calcium, Ion: 1.24 mmol/L (ref 1.15–1.40)
Chloride: 104 mmol/L (ref 98–111)
Creatinine, Ser: 1 mg/dL (ref 0.44–1.00)
Glucose, Bld: 90 mg/dL (ref 70–99)
HCT: 45 % (ref 36.0–46.0)
Hemoglobin: 15.3 g/dL — ABNORMAL HIGH (ref 12.0–15.0)
Potassium: 4 mmol/L (ref 3.5–5.1)
Sodium: 139 mmol/L (ref 135–145)
TCO2: 26 mmol/L (ref 22–32)

## 2023-03-18 LAB — POC OCCULT BLOOD, ED: Fecal Occult Bld: NEGATIVE

## 2023-03-18 NOTE — ED Triage Notes (Signed)
Pt states she was standing at work and felt like something was running out of her backside and went to the bathroom and had a lot of bright red blood in underclothes and in the toilet. Denies a BM today. Denies any pain. Unsure if hemorrhoids are present.

## 2023-03-18 NOTE — Discharge Instructions (Signed)
Return to the emergency department for any further episodes of bleeding.  Otherwise, call the number below to follow-up with Dr. Jena Gauss.

## 2023-03-18 NOTE — ED Provider Notes (Signed)
[facility] AT [facility] Provider Note   CSN: [id number] Arrival date & time: 03/18/23  1028     History  Chief Complaint  Patient presents with   Rectal Bleeding    [name] [name] is a 72 y.o. female.   Rectal Bleeding Patient presents for rectal bleeding.  Medical history includes HLD, HTN, hemorrhoids.  Per chart review, last colonoscopy was 3 years ago.  At the time, they did note diverticula and polyps.  This morning, patient was in her normal state of health.  Today while at work, she experienced painless rectal bleeding.  Blood was bright red.  She describes a small amount of blood loss.  This was approximately 2 hours prior to arrival.  She denies any symptoms since that time.  She has not had any dizziness or lightheadedness.  She is not on a blood thinner.     Home Medications Prior to Admission medications   Medication Sig Start Date End Date Taking? Authorizing Provider  diclofenac Sodium (VOLTAREN) 1 % GEL Apply 1 application topically 3 (three) times daily as needed for pain. 05/05/20   [provider]  lisinopril (PRINIVIL,ZESTRIL) 20 MG tablet Take 20 mg by mouth daily.    [provider]  Polyethyl Glycol-Propyl Glycol (LUBRICANT EYE DROPS) 0.4-0.3 % SOLN Place 1 drop into both eyes 3 (three) times daily as needed (dry/irritated eyes.).    [provider]  triamcinolone (KENALOG) 0.025 % cream Apply 1 application topically 2 (two) times daily as needed (skin rash).  05/05/20   [provider]      Allergies    Ketorolac tromethamine, Ivp dye [iodinated contrast media], Nitrofuran derivatives, Vicodin [hydrocodone-acetaminophen], and Latex    Review of Systems   Review of Systems  Gastrointestinal:  Positive for anal bleeding and hematochezia.  All other systems reviewed and are negative.   Physical Exam Updated Vital Signs BP (!) 147/69 (BP Location: Left Arm)   Pulse (!) 50   Temp (!) 97.5  F (36.4 C) (Oral)   Resp 16   Ht 5\' 6"  (1.676 m)   Wt 98 kg   SpO2 100%   BMI 34.86 kg/m  Physical Exam Vitals and nursing note reviewed. Exam conducted with a chaperone present.  Constitutional:      General: She is not in acute distress.    Appearance: Normal appearance. She is well-developed. She is not ill-appearing, toxic-appearing or diaphoretic.  HENT:     Head: Normocephalic and atraumatic.     Right Ear: External ear normal.     Left Ear: External ear normal.     Nose: Nose normal.     Mouth/Throat:     Mouth: Mucous membranes are moist.  Eyes:     Extraocular Movements: Extraocular movements intact.     Conjunctiva/sclera: Conjunctivae normal.  Cardiovascular:     Rate and Rhythm: Normal rate and regular rhythm.  Pulmonary:     Effort: Pulmonary effort is normal. No respiratory distress.  Abdominal:     General: There is no distension.     Palpations: Abdomen is soft.     Tenderness: There is no abdominal tenderness.  Genitourinary:    Rectum: Internal hemorrhoid present.     Comments: No presence of hematochezia or melena on DRE. Musculoskeletal:        General: No swelling. Normal range of motion.     Cervical back: Normal range of motion and neck supple.  Skin:    General:  Skin is warm and dry.     Coloration: Skin is not jaundiced or pale.  Neurological:     General: No focal deficit present.     Mental Status: She is alert and oriented to person, place, and time.  Psychiatric:        Mood and Affect: Mood normal.        Behavior: Behavior normal.     ED Results / Procedures / Treatments   Labs (all labs ordered are listed, but only abnormal results are displayed) Labs Reviewed  COMPREHENSIVE METABOLIC PANEL - Abnormal; Notable for the following components:      Result Value   Total Bilirubin 0.2 (*)    All other components within normal limits  I-STAT CHEM 8, ED - Abnormal; Notable for the following components:   Hemoglobin 15.3 (*)    All  other components within normal limits  LIPASE, BLOOD  CBC WITH DIFFERENTIAL/PLATELET  LACTIC ACID, PLASMA  MAGNESIUM  URINALYSIS, ROUTINE W REFLEX MICROSCOPIC  OCCULT BLOOD X 1 CARD TO LAB, STOOL  POC OCCULT BLOOD, ED  TYPE AND SCREEN    EKG None  Radiology No results found.  Procedures Procedures    Medications Ordered in ED Medications - No data to display  ED Course/ Medical Decision Making/ A&P                                 Medical Decision Making Amount and/or Complexity of Data Reviewed Labs: ordered.   This patient presents to the ED for concern of rectal bleeding, this involves an extensive number of treatment options, and is a complaint that carries with it a high risk of complications and morbidity.  The differential diagnosis includes hemorrhoidal bleed, diverticular bleed, neoplasm   Co morbidities that complicate the patient evaluation  HLD, HTN, hemorrhoids   Additional history obtained:  Additional history obtained from N/A External records from outside source obtained and reviewed including EMR   Lab Tests:  I Ordered, and personally interpreted labs.  The pertinent results include: Hemoglobin better than baseline, no leukocytosis, normal electrolytes, normal lactate  Cardiac Monitoring: / EKG:  The patient was maintained on a cardiac monitor.  I personally viewed and interpreted the cardiac monitored which showed an underlying rhythm of: Sinus rhythm  Problem List / ED Course / Critical interventions / Medication management  Patient presents for 1 episode of painless rectal bleeding approximately 2 hours prior to arrival.  She has since been asymptomatic.  She is well-appearing on arrival in the ED.  Vital signs are notable for hypertension.  DRE was performed with nurse chaperone present.  Internal hemorrhoid was appreciated.  No bleeding is present.  Laboratory workup was initiated.  On CBC, patient's hemoglobin is better than baseline.   No leukocytosis is present.  Other lab results are reassuring.  Patient was observed in the ED for 4 hours without any further episodes of bleeding.  She was offered continued observation but does feel comfortable with discharge home.  She states that she lives close by and will return for any further bleeding episodes.  She was advised to follow-up with her GI doctor.  She was discharged in stable condition.  Social Determinants of Health:  Lives independently, has access to outpatient care        Final Clinical Impression(s) / ED Diagnoses Final diagnoses:  Rectal bleeding    Rx / DC Orders ED Discharge Orders  None         Gloris Manchester, MD 03/18/23 1447

## 2023-04-15 ENCOUNTER — Ambulatory Visit: Payer: 59 | Admitting: Gastroenterology

## 2023-04-18 ENCOUNTER — Other Ambulatory Visit (HOSPITAL_COMMUNITY): Payer: Self-pay | Admitting: Family Medicine

## 2023-04-18 DIAGNOSIS — Z1231 Encounter for screening mammogram for malignant neoplasm of breast: Secondary | ICD-10-CM

## 2023-04-19 ENCOUNTER — Encounter: Payer: Self-pay | Admitting: Gastroenterology

## 2023-04-19 NOTE — Progress Notes (Deleted)
Referring Provider:Llamas, Alvester Morin, MD  Primary Care Physician:  Jannet Askew, MD Primary Gastroenterologist:  Dr. Bonnetta Barry chief complaint on file.   HPI:   [name] [name] is a 72 y.o. female presenting today at the request of Llamas, Jewel C, MD for consult colonoscopy.   Last TCS December 2021 Diverticulosis in the sigmoid colon and in the descending colon. 3 polyps removed, 1 benign tissue, 2 tubular adenomas. Recommended 5 year surveillance.   Today:       Family history of colon cancer: yes, brothers: age 49 and 80 dad: age: 68   Past Medical History:  Diagnosis Date   Adenomatous colon polyp 2007   Chronic renal insufficiency    DDD (degenerative disc disease), lumbar    Depression    Diverticulosis 2010   Glucose intolerance (impaired glucose tolerance)    Hyperlipidemia    Hypertension    Tubular adenoma of colon 11/2011    Past Surgical History:  Procedure Laterality Date   APPENDECTOMY     COLONOSCOPY  08/02/2005   Rourk-Internal hemorrhoids, otherwise normal rectum. adenoma   COLONOSCOPY  11/14/2011   RMR: Friable anal rectum-likely source of hematochezia Early sigmoid diverticular changes. Colonic tubular adenoma removed    COLONOSCOPY N/A 05/17/2015   RMR: Internal hemorrhoids likely source of hematochezia in the setting of significant constipation. Colonic diverticulosis.    COLONOSCOPY N/A 06/21/2020   Procedure: COLONOSCOPY;  Surgeon: Corbin Ade, MD;  Location: [facility];  Service: Endoscopy;  Laterality: N/A;  1:00   Ileocolonoscopy  08/22/2008   Rourk- Friable anal canal, otherwise normal-appearing rectal mucosa, early shallow left-sided diverticula, colonic mucosa, and  terminal ileal mucosa appeared normal.    POLYPECTOMY  06/21/2020   Procedure: POLYPECTOMY;  Surgeon: Corbin Ade, MD;  Location: [facility];  Service: Endoscopy;;  ileocecal valve, descending x2,   REPLACEMENT TOTAL KNEE  2002    Current Outpatient Medications   Medication Sig Dispense Refill   diclofenac Sodium (VOLTAREN) 1 % GEL Apply 1 application topically 3 (three) times daily as needed for pain.     lisinopril (PRINIVIL,ZESTRIL) 20 MG tablet Take 20 mg by mouth daily.     Polyethyl Glycol-Propyl Glycol (LUBRICANT EYE DROPS) 0.4-0.3 % SOLN Place 1 drop into both eyes 3 (three) times daily as needed (dry/irritated eyes.).     triamcinolone (KENALOG) 0.025 % cream Apply 1 application topically 2 (two) times daily as needed (skin rash).      No current facility-administered medications for this visit.    Allergies as of 04/21/2023 - Review Complete 03/18/2023  Allergen Reaction Noted   Ketorolac tromethamine Anaphylaxis    Ivp dye [iodinated contrast media] Nausea And Vomiting 02/01/2017   Nitrofuran derivatives Other (See Comments) 04/24/2016   Vicodin [hydrocodone-acetaminophen] Other (See Comments) 09/23/2015   Latex Hives     Family History  Problem Relation Age of Onset   Colon cancer Brother 26   Lung cancer Brother    Colon cancer Father 54   Colon cancer Brother 32    Social History   Socioeconomic History   Marital status: Divorced    Spouse name: Not on file   Number of children: Not on file   Years of education: Not on file   Highest education level: Not on file  Occupational History   Occupation: disabled, [place]    Employer: UNEMPLOYED  Tobacco Use   Smoking status: Never   Smokeless tobacco: Never  Advertising account planner  Vaping status: Never Used  Substance and Sexual Activity   Alcohol use: No   Drug use: No   Sexual activity: Not Currently    Partners: Male    Birth control/protection: None    Comment: boyfriend  Other Topics Concern   Not on file  Social History Narrative   Not on file   Social Determinants of Health   Financial Resource Strain: Not on file  Food Insecurity: Not on file  Transportation Needs: Not on file  Physical Activity: Not on file  Stress: Not on file  Social Connections: Not on  file  Intimate Partner Violence: Not on file    Review of Systems: Gen: Denies any fever, chills, fatigue, weight loss, lack of appetite.  CV: Denies chest pain, heart palpitations, peripheral edema, syncope.  Resp: Denies shortness of breath at rest or with exertion. Denies wheezing or cough.  GI: Denies dysphagia or odynophagia. Denies jaundice, hematemesis, fecal incontinence. GU : Denies urinary burning, urinary frequency, urinary hesitancy MS: Denies joint pain, muscle weakness, cramps, or limitation of movement.  Derm: Denies rash, itching, dry skin Psych: Denies depression, anxiety, memory loss, and confusion Heme: Denies bruising, bleeding, and enlarged lymph nodes.  Physical Exam: There were no vitals taken for this visit. General:   Alert and oriented. Pleasant and cooperative. Well-nourished and well-developed.  Head:  Normocephalic and atraumatic. Eyes:  Without icterus, sclera clear and conjunctiva pink.  Ears:  Normal auditory acuity. Lungs:  Clear to auscultation bilaterally. No wheezes, rales, or rhonchi. No distress.  Heart:  S1, S2 present without murmurs appreciated.  Abdomen:  +BS, soft, non-tender and non-distended. No HSM noted. No guarding or rebound. No masses appreciated.  Rectal:  Deferred  Msk:  Symmetrical without gross deformities. Normal posture. Extremities:  Without edema. Neurologic:  Alert and  oriented x4;  grossly normal neurologically. Skin:  Intact without significant lesions or rashes. Psych:  Alert and cooperative. Normal mood and affect.    Assessment:     Plan:  ***   Ermalinda Memos, PA-C [facility] 04/21/2023

## 2023-04-21 ENCOUNTER — Encounter: Payer: Self-pay | Admitting: Gastroenterology

## 2023-04-21 ENCOUNTER — Ambulatory Visit: Payer: 59 | Admitting: Gastroenterology

## 2023-04-30 ENCOUNTER — Ambulatory Visit (INDEPENDENT_AMBULATORY_CARE_PROVIDER_SITE_OTHER): Payer: 59 | Admitting: Internal Medicine

## 2023-04-30 ENCOUNTER — Encounter: Payer: Self-pay | Admitting: Internal Medicine

## 2023-04-30 VITALS — BP 151/86 | HR 53 | Temp 97.8°F | Ht 66.0 in | Wt 218.7 lb

## 2023-04-30 DIAGNOSIS — K648 Other hemorrhoids: Secondary | ICD-10-CM

## 2023-04-30 DIAGNOSIS — D124 Benign neoplasm of descending colon: Secondary | ICD-10-CM

## 2023-04-30 DIAGNOSIS — Z860101 Personal history of adenomatous and serrated colon polyps: Secondary | ICD-10-CM

## 2023-04-30 DIAGNOSIS — K625 Hemorrhage of anus and rectum: Secondary | ICD-10-CM

## 2023-04-30 NOTE — Progress Notes (Signed)
Referring Provider: Jannet Askew, MD Primary Care Physician:  Jannet Askew, MD Primary GI:  Dr. Marletta Lor  Chief Complaint  Patient presents with   Hemorrhoids    Referred for rectal bleeding and hemorrhoids.     HPI:   [name] [name] is a 72 y.o. female who presents to clinic today for ER follow-up visit.  Presented to [facility] ER 03/18/2023 with episode of rectal bleeding.  Notes occurred at work 1 episode of bright red blood in the toilet and tissue paper.  No clots.  No rectal pain or discomfort.  No abdominal pain.  In the ER, blood work largely unremarkable.  Digital rectal exam by ER provider noted internal hemorrhoids, no gross melena hematochezia.  Treated conservatively and discharged home.  Last colonoscopy 06/21/2020 noted diverticulosis in the sigmoid descending colons, 3 polyps removed in the descending colon 2 which were tubular adenomas.  Recommended 5-year recall.  Patient denies any issues with constipation or diarrhea.  No rectal discomfort.  States she had 1 small episode of bleeding since her ER visit though she admits to taking ibuprofen which she believes led to the bleeding itself.    Past Medical History:  Diagnosis Date   Adenomatous colon polyp 2007   Chronic renal insufficiency    DDD (degenerative disc disease), lumbar    Depression    Diverticulosis 2010   Glucose intolerance (impaired glucose tolerance)    Hyperlipidemia    Hypertension    Tubular adenoma of colon 11/2011    Past Surgical History:  Procedure Laterality Date   APPENDECTOMY     COLONOSCOPY  08/02/2005   Rourk-Internal hemorrhoids, otherwise normal rectum. adenoma   COLONOSCOPY  11/14/2011   RMR: Friable anal rectum-likely source of hematochezia Early sigmoid diverticular changes. Colonic tubular adenoma removed    COLONOSCOPY N/A 05/17/2015   RMR: Internal hemorrhoids likely source of hematochezia in the setting of significant constipation. Colonic diverticulosis.     COLONOSCOPY N/A 06/21/2020   Dr. Jena Gauss; Diverticulosis in the sigmoid colon and in the descending colon. 3 polyps removed, 1 benign tissue, 2 tubular adenomas. Recommended 5 year surveillance.   Ileocolonoscopy  08/22/2008   Rourk- Friable anal canal, otherwise normal-appearing rectal mucosa, early shallow left-sided diverticula, colonic mucosa, and  terminal ileal mucosa appeared normal.    POLYPECTOMY  06/21/2020   Procedure: POLYPECTOMY;  Surgeon: Corbin Ade, MD;  Location: [facility];  Service: Endoscopy;;  ileocecal valve, descending x2,   REPLACEMENT TOTAL KNEE  07/08/2000    Current Outpatient Medications  Medication Sig Dispense Refill   diclofenac Sodium (VOLTAREN) 1 % GEL Apply 1 application topically 3 (three) times daily as needed for pain.     lisinopril (PRINIVIL,ZESTRIL) 20 MG tablet Take 20 mg by mouth daily.     triamcinolone (KENALOG) 0.025 % cream Apply 1 application topically 2 (two) times daily as needed (skin rash).      No current facility-administered medications for this visit.    Allergies as of 04/30/2023 - Review Complete 04/30/2023  Allergen Reaction Noted   Ketorolac tromethamine Anaphylaxis    Ivp dye [iodinated contrast media] Nausea And Vomiting 02/01/2017   Nitrofuran derivatives Other (See Comments) 04/24/2016   Vicodin [hydrocodone-acetaminophen] Other (See Comments) 09/23/2015   Latex Hives     Family History  Problem Relation Age of Onset   Colon cancer Brother 30   Lung cancer Brother    Colon cancer Father 10   Colon cancer Brother 4  Social History   Socioeconomic History   Marital status: Divorced    Spouse name: Not on file   Number of children: Not on file   Years of education: Not on file   Highest education level: Not on file  Occupational History   Occupation: disabled, Midwife: UNEMPLOYED  Tobacco Use   Smoking status: Never   Smokeless tobacco: Never  Vaping Use   Vaping status: Never Used   Substance and Sexual Activity   Alcohol use: No   Drug use: No   Sexual activity: Not Currently    Partners: Male    Birth control/protection: None    Comment: boyfriend  Other Topics Concern   Not on file  Social History Narrative   Not on file   Social Determinants of Health   Financial Resource Strain: Not on file  Food Insecurity: Not on file  Transportation Needs: Not on file  Physical Activity: Not on file  Stress: Not on file  Social Connections: Not on file    Subjective: Review of Systems  Constitutional:  Negative for chills and fever.  HENT:  Negative for congestion and hearing loss.   Eyes:  Negative for blurred vision and double vision.  Respiratory:  Negative for cough and shortness of breath.   Cardiovascular:  Negative for chest pain and palpitations.  Gastrointestinal:  Negative for abdominal pain, blood in stool, constipation, diarrhea, heartburn, melena and vomiting.  Genitourinary:  Negative for dysuria and urgency.  Musculoskeletal:  Negative for joint pain and myalgias.  Skin:  Negative for itching and rash.  Neurological:  Negative for dizziness and headaches.  Psychiatric/Behavioral:  Negative for depression. The patient is not nervous/anxious.      Objective: BP (!) 151/86   Pulse (!) 53   Temp 97.8 F (36.6 C) (Oral)   Ht 5\' 6"  (1.676 m)   Wt 218 lb 11.2 oz (99.2 kg)   BMI 35.30 kg/m  Physical Exam Constitutional:      Appearance: Normal appearance.  HENT:     Head: Normocephalic and atraumatic.  Eyes:     Extraocular Movements: Extraocular movements intact.     Conjunctiva/sclera: Conjunctivae normal.  Cardiovascular:     Rate and Rhythm: Normal rate and regular rhythm.  Pulmonary:     Effort: Pulmonary effort is normal.     Breath sounds: Normal breath sounds.  Abdominal:     General: Bowel sounds are normal.     Palpations: Abdomen is soft.  Musculoskeletal:        General: No swelling. Normal range of motion.      Cervical back: Normal range of motion and neck supple.  Skin:    General: Skin is warm and dry.     Coloration: Skin is not jaundiced.  Neurological:     General: No focal deficit present.     Mental Status: She is alert and oriented to person, place, and time.  Psychiatric:        Mood and Affect: Mood normal.        Behavior: Behavior normal.   Rectal exam deferred by patient**   Assessment: *Rectal bleeding-resolved *Internal hemorrhoids *Adenomatous colon polyps  Plan: Discussed patient's symptoms in depth with her today.  Bleeding has resolved.  Likely benign anorectal source such as internal hemorrhoids.  Rectal exam deferred by patient today..  Offered colonoscopy to further evaluate, she states she would rather continue to monitor for now..  If she has further episodes of  bleeding she will call our office and we will schedule for colonoscopy.  I did counsel patient about not pursuing colonoscopy to further evaluate that we could be missing something more ominous such as malignancy and she understands and is agreeable.  She will be due for colonoscopy 2026 for polyp surveillance, otherwise follow-up as needed.  04/30/2023 3:08 PM   Disclaimer: This note was dictated with voice recognition software. Similar sounding words can inadvertently be transcribed and may not be corrected upon review.

## 2023-04-30 NOTE — Patient Instructions (Addendum)
I think it is okay to continue to monitor your rectal bleeding for now.  If you have resurgence of your symptoms then let us know and we can schedule for colonoscopy to further evaluate.  You will be due for colonoscopy for polyp surveillance purposes in 2026.  Follow-up as needed.  It was very nice meeting you today.  Dr. Marletta Lor

## 2023-05-01 ENCOUNTER — Encounter: Payer: Self-pay | Admitting: Internal Medicine

## 2023-05-19 NOTE — H&P (Signed)
Surgical History & Physical  Patient Name: [name] [name]  DOB: 1951/03/10  Surgery: Cataract extraction with intraocular lens implant phacoemulsification; Right Eye Surgeon: [name] MD Surgery Date: 05/23/2023 Pre-Op Date: 05/01/2023  HPI: A 4 Yr. old female patient 1. The patient is returning for a cataract 1 year follow-up of both eyes. Since the last visit, vision appears the same. Patient states that she has a large problem driving in the dark because of glare and inability to see objects. This is important because she starts work at [employer]. This is happening in both eyes. It is worsening. It is constant. This is negatively affecting the patient's quality of life and the patient is unable to function adequately in life with the current level of vision. She c/o dry eyes. She has to blink eyes several times to moisturize them. She denies using eye drops. HPI was performed by [name] .  Medical History: Cataracts  High Blood Pressure  Review of Systems Cardiovascular High Blood Pressure All recorded systems are negative except as noted above.  Social Never smoked  Medication lisinopril ,  prednisolone acetate ,  Ilevro ,  ofloxacin   Sx/Procedures Knee Replacement  Drug Allergies  Toradol   History & Physical: Heent: cataract NECK: supple without bruits LUNGS: lungs clear to auscultation CV: regular rate and rhythm Abdomen: soft and non-tender  Impression & Plan: Assessment: 1.  COMBINED FORMS AGE RELATED CATARACT; Both Eyes (H25.813) 2.  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  DERMATOCHALASIS, no surgery; Right Upper Lid, Left Upper Lid (H02.831, H02.834) 4.  BLEPHARITIS; Right Upper Lid, Right Lower Lid, Left Upper Lid, Left Lower Lid (H01.001, H01.002,H01.004,H01.005) 5.  ARCUS SENILIS; Both Eyes (H18.413) 6.  Pinguecula; Both Eyes (H11.153) 7.  Dry Eye Syndrome; Both Eyes (H04.123) 8.  Hyperopia ; Left Eye (H52.02)  Plan: 1.  Cataract accounts for the  patient's decreased vision. This visual impairment is not correctable with a tolerable change in glasses or contact lenses. Cataract surgery with an implantation of a new lens should significantly improve the visual and functional status of the patient. Discussed all risks, benefits, alternatives, and potential complications. Discussed the procedures and recovery. Patient desires to have surgery. A-scan ordered and performed today for intra-ocular lens calculations. The surgery will be performed in order to improve vision for driving, reading, and for eye examinations. Recommend phacoemulsification with intra-ocular lens. Recommend Dextenza for post-operative pain and inflammation. Right Eye non-dominant - first.. Dilates well - shugarcaine by protocol. Vision [employer]. - dense cortical.  2.  1 year ago. Episode of complete blacked out vision. Is having full workup per PCP.  3.  Asymptomatic, recommend observation for now. Findings, prognosis and treatment options reviewed.  4.  Recommended regular lid cleaning.  5.  Discussed significance of finding Answered patient questions about finding  6.  Observe; Artificial tears as needed for irritation.  7.  Preservative Free Artificial tears 1 drop 2-3x/day.  8.

## 2023-05-20 ENCOUNTER — Encounter (HOSPITAL_COMMUNITY)
Admission: RE | Admit: 2023-05-20 | Discharge: 2023-05-20 | Disposition: A | Discharge status: Home / Self Care | Payer: 59 | Source: Ambulatory Visit | Attending: Ophthalmology | Admitting: Ophthalmology

## 2023-05-20 ENCOUNTER — Encounter (HOSPITAL_COMMUNITY): Payer: Self-pay

## 2023-05-20 ENCOUNTER — Other Ambulatory Visit: Payer: Self-pay

## 2023-05-23 ENCOUNTER — Ambulatory Visit (HOSPITAL_COMMUNITY): Payer: 59 | Admitting: Anesthesiology

## 2023-05-23 ENCOUNTER — Encounter (HOSPITAL_COMMUNITY)
Admission: RE | Disposition: A | Discharge status: Home / Self Care | Payer: Self-pay | Source: Home / Self Care | Attending: Ophthalmology

## 2023-05-23 ENCOUNTER — Ambulatory Visit (HOSPITAL_COMMUNITY)
Admission: RE | Admit: 2023-05-23 | Discharge: 2023-05-23 | Disposition: A | Discharge status: Home / Self Care | Payer: 59 | Attending: Ophthalmology | Admitting: Ophthalmology

## 2023-05-23 ENCOUNTER — Encounter (HOSPITAL_COMMUNITY): Payer: Self-pay | Admitting: Ophthalmology

## 2023-05-23 ENCOUNTER — Other Ambulatory Visit: Payer: Self-pay

## 2023-05-23 DIAGNOSIS — H25811 Combined forms of age-related cataract, right eye: Secondary | ICD-10-CM | POA: Diagnosis not present

## 2023-05-23 DIAGNOSIS — I10 Essential (primary) hypertension: Secondary | ICD-10-CM | POA: Diagnosis not present

## 2023-05-23 DIAGNOSIS — R7303 Prediabetes: Secondary | ICD-10-CM | POA: Insufficient documentation

## 2023-05-23 HISTORY — PX: CATARACT EXTRACTION W/PHACO: SHX586

## 2023-05-23 SURGERY — PHACOEMULSIFICATION, CATARACT, WITH IOL INSERTION
Anesthesia: Monitor Anesthesia Care | Site: Eye | Laterality: Right

## 2023-05-23 MED ORDER — BSS IO SOLN
INTRAOCULAR | Status: DC | PRN
  Administered 2023-05-23: 15 mL via INTRAOCULAR

## 2023-05-23 MED ORDER — PHENYLEPHRINE HCL 2.5 % OP SOLN
1.0000 [drp] | OPHTHALMIC | Status: AC | PRN
  Administered 2023-05-23 (×3): 1 [drp] via OPHTHALMIC

## 2023-05-23 MED ORDER — SODIUM HYALURONATE 23MG/ML IO SOSY
PREFILLED_SYRINGE | INTRAOCULAR | Status: DC | PRN
  Administered 2023-05-23: .6 mL via INTRAOCULAR

## 2023-05-23 MED ORDER — SODIUM CHLORIDE 0.9% FLUSH
INTRAVENOUS | Status: DC | PRN
  Administered 2023-05-23: 5 mL via INTRAVENOUS

## 2023-05-23 MED ORDER — PHENYLEPHRINE-KETOROLAC 1-0.3 % IO SOLN
INTRAOCULAR | Status: DC | PRN
  Administered 2023-05-23: 500 mL via OPHTHALMIC

## 2023-05-23 MED ORDER — SODIUM HYALURONATE 10 MG/ML IO SOLUTION
PREFILLED_SYRINGE | INTRAOCULAR | Status: DC | PRN
  Administered 2023-05-23: .85 mL via INTRAOCULAR

## 2023-05-23 MED ORDER — MIDAZOLAM HCL 5 MG/5ML IJ SOLN
INTRAMUSCULAR | Status: DC | PRN
  Administered 2023-05-23: 2 mg via INTRAVENOUS

## 2023-05-23 MED ORDER — LIDOCAINE HCL (PF) 1 % IJ SOLN
INTRAOCULAR | Status: DC | PRN
  Administered 2023-05-23: 1 mL via OPHTHALMIC

## 2023-05-23 MED ORDER — MIDAZOLAM HCL 2 MG/2ML IJ SOLN
INTRAMUSCULAR | Status: AC
  Filled 2023-05-23: qty 2

## 2023-05-23 MED ORDER — POVIDONE-IODINE 5 % OP SOLN
OPHTHALMIC | Status: DC | PRN
  Administered 2023-05-23: 1 via OPHTHALMIC

## 2023-05-23 MED ORDER — TETRACAINE HCL 0.5 % OP SOLN
1.0000 [drp] | OPHTHALMIC | Status: AC | PRN
  Administered 2023-05-23 (×3): 1 [drp] via OPHTHALMIC

## 2023-05-23 MED ORDER — LIDOCAINE HCL 3.5 % OP GEL
1.0000 | Freq: Once | OPHTHALMIC | Status: AC
  Administered 2023-05-23: 1 via OPHTHALMIC

## 2023-05-23 MED ORDER — STERILE WATER FOR IRRIGATION IR SOLN
Status: DC | PRN
  Administered 2023-05-23: 250 mL

## 2023-05-23 MED ORDER — TROPICAMIDE 1 % OP SOLN
1.0000 [drp] | OPHTHALMIC | Status: AC | PRN
  Administered 2023-05-23 (×3): 1 [drp] via OPHTHALMIC

## 2023-05-23 MED ORDER — MOXIFLOXACIN HCL 5 MG/ML IO SOLN
INTRAOCULAR | Status: DC | PRN
  Administered 2023-05-23: .2 mL via INTRACAMERAL

## 2023-05-23 SURGICAL SUPPLY — 15 items
CATARACT SUITE SIGHTPATH (MISCELLANEOUS) ×1
CLOTH BEACON ORANGE TIMEOUT ST (SAFETY) ×1 IMPLANT
EYE SHIELD UNIVERSAL CLEAR (GAUZE/BANDAGES/DRESSINGS) IMPLANT
FEE CATARACT SUITE SIGHTPATH (MISCELLANEOUS) ×1 IMPLANT
GLOVE BIOGEL PI IND STRL 7.0 (GLOVE) ×2 IMPLANT
LENS IOL TECNIS EYHANCE 18.0 (Intraocular Lens) IMPLANT
NDL HYPO 18GX1.5 BLUNT FILL (NEEDLE) ×1 IMPLANT
NEEDLE HYPO 18GX1.5 BLUNT FILL (NEEDLE) ×1
PAD ARMBOARD 7.5X6 YLW CONV (MISCELLANEOUS) ×1 IMPLANT
POSITIONER HEAD 8X9X4 ADT (SOFTGOODS) ×1 IMPLANT
RING MALYGIN 7.0 (MISCELLANEOUS) IMPLANT
SYR TB 1ML LL NO SAFETY (SYRINGE) ×1 IMPLANT
TAPE PAPER 2X10 WHT MICROPORE (GAUZE/BANDAGES/DRESSINGS) IMPLANT
TAPE SURG TRANSPORE 1 IN (GAUZE/BANDAGES/DRESSINGS) IMPLANT
WATER STERILE IRR 250ML POUR (IV SOLUTION) ×1 IMPLANT

## 2023-05-23 NOTE — Transfer of Care (Signed)
Immediate Anesthesia Transfer of Care Note  Patient: [name] [name]  Procedure(s) Performed: CATARACT EXTRACTION PHACO AND INTRAOCULAR LENS PLACEMENT (IOC) (Right: Eye)  Patient Location: Short Stay  Anesthesia Type:MAC  Level of Consciousness: awake  Airway & Oxygen Therapy: Patient Spontanous Breathing  Post-op Assessment: Report given to RN  Post vital signs: Reviewed and stable  Last Vitals:  Vitals Value Taken Time  BP 142/74 05/23/23 1024  Temp 36.3 C 05/23/23 1024  Pulse 56 05/23/23 1024  Resp 16 05/23/23 1024  SpO2 100 % 05/23/23 1024    Last Pain:  Vitals:   05/23/23 1024  TempSrc: Oral  PainSc: 0-No pain         Complications: No notable events documented.

## 2023-05-23 NOTE — Anesthesia Postprocedure Evaluation (Signed)
Anesthesia Post Note  Patient: [name] [name]  Procedure(s) Performed: CATARACT EXTRACTION PHACO AND INTRAOCULAR LENS PLACEMENT (IOC) (Right: Eye)  Patient location during evaluation: Short Stay Anesthesia Type: MAC Level of consciousness: awake and alert Pain management: pain level controlled Vital Signs Assessment: post-procedure vital signs reviewed and stable Respiratory status: spontaneous breathing Cardiovascular status: blood pressure returned to baseline and stable Postop Assessment: no apparent nausea or vomiting Anesthetic complications: no   No notable events documented.   Last Vitals:  Vitals:   05/23/23 0906 05/23/23 1024  BP: (!) 162/83 (!) 142/74  Pulse: (!) 55 (!) 56  Resp: 14 16  Temp: 36.5 C (!) 36.3 C  SpO2: 99% 100%    Last Pain:  Vitals:   05/23/23 1024  TempSrc: Oral  PainSc: 0-No pain                 Eligah Anello

## 2023-05-23 NOTE — Interval H&P Note (Signed)
History and Physical Interval Note:  05/23/2023 9:54 AM  [name] [name]  has presented today for surgery, with the diagnosis of combined forms age related cataract, right eye.  The various methods of treatment have been discussed with the patient and family. After consideration of risks, benefits and other options for treatment, the patient has consented to  Procedure(s) with comments: CATARACT EXTRACTION PHACO AND INTRAOCULAR LENS PLACEMENT (IOC) (Right) - CDE: as a surgical intervention.  The patient's history has been reviewed, patient examined, no change in status, stable for surgery.  I have reviewed the patient's chart and labs.  Questions were answered to the patient's satisfaction.     [name]

## 2023-05-23 NOTE — Discharge Instructions (Signed)
Please discharge patient when stable, will follow up today with Dr. Carnella Fryman at the [facility] office immediately following discharge.  Leave shield in place until visit.  All paperwork with discharge instructions will be given at the office.  [facility] Address:  [address]  [place], [place] [address]  

## 2023-05-23 NOTE — Anesthesia Preprocedure Evaluation (Signed)
Anesthesia Evaluation  Patient identified by MRN, date of birth, ID band Patient awake    Reviewed: Allergy & Precautions, H&P , NPO status , Patient's Chart, lab work & pertinent test results, reviewed documented beta blocker date and time   Airway Mallampati: II  TM Distance: >3 FB Neck ROM: full    Dental no notable dental hx. (+) Dental Advisory Given, Teeth Intact   Pulmonary neg pulmonary ROS   Pulmonary exam normal breath sounds clear to auscultation       Cardiovascular Exercise Tolerance: Good hypertension, Normal cardiovascular exam Rhythm:regular Rate:Normal     Neuro/Psych  PSYCHIATRIC DISORDERS  Depression    negative neurological ROS     GI/Hepatic negative GI ROS, Neg liver ROS,,,  Endo/Other  negative endocrine ROS  Pre diabetic  Renal/GU Renal InsufficiencyRenal disease  negative genitourinary   Musculoskeletal  (+) Arthritis , Osteoarthritis,    Abdominal   Peds  Hematology negative hematology ROS (+)   Anesthesia Other Findings   Reproductive/Obstetrics negative OB ROS                             Anesthesia Physical Anesthesia Plan  ASA: 3  Anesthesia Plan: MAC   Post-op Pain Management:    Induction:   PONV Risk Score and Plan:   Airway Management Planned: Nasal Cannula and Natural Airway  Additional Equipment: None  Intra-op Plan:   Post-operative Plan:   Informed Consent: I have reviewed the patients History and Physical, chart, labs and discussed the procedure including the risks, benefits and alternatives for the proposed anesthesia with the patient or authorized representative who has indicated his/her understanding and acceptance.     Dental Advisory Given  Plan Discussed with: CRNA  Anesthesia Plan Comments:        Anesthesia Quick Evaluation

## 2023-05-23 NOTE — Op Note (Signed)
Date of procedure: 05/23/23  Pre-operative diagnosis:  Visually significant combined form age-related cataract, Right Eye (H25.811)  Post-operative diagnosis:  Visually significant combined form age-related cataract, Right Eye (H25.811)  Procedure: Removal of cataract via phacoemulsification and insertion of intra-ocular lens Laural Benes and Johnson DIB00 +18.0D into the capsular bag of the Right Eye  Attending surgeon: Rudy Jew. Shandi Godfrey, MD, MA  Anesthesia: MAC, Topical Akten  Complications: None  Estimated Blood Loss: <11mL (minimal)  Specimens: None  Implants: As above  Indications:  Visually significant age-related cataract, Right Eye  Procedure:  The patient was seen and identified in the pre-operative area. The operative eye was identified and dilated.  The operative eye was marked.  Topical anesthesia was administered to the operative eye.     The patient was then to the operative suite and placed in the supine position.  A timeout was performed confirming the patient, procedure to be performed, and all other relevant information.   The patient's face was prepped and draped in the usual fashion for intra-ocular surgery.  A lid speculum was placed into the operative eye and the surgical microscope moved into place and focused.  A superotemporal paracentesis was created using a 20 gauge paracentesis blade.  Shugarcaine was injected into the anterior chamber.  Viscoelastic was injected into the anterior chamber.  A temporal clear-corneal main wound incision was created using a 2.7mm microkeratome.  A continuous curvilinear capsulorrhexis was initiated using an irrigating cystitome and completed using capsulorrhexis forceps.  Hydrodissection and hydrodeliniation were performed.  Viscoelastic was injected into the anterior chamber.  A phacoemulsification handpiece and a chopper as a second instrument were used to remove the nucleus and epinucleus. The irrigation/aspiration handpiece was used to  remove any remaining cortical material.   The capsular bag was reinflated with viscoelastic, checked, and found to be intact.  The intraocular lens was inserted into the capsular bag.  The irrigation/aspiration handpiece was used to remove any remaining viscoelastic.  The clear corneal wound and paracentesis wounds were then hydrated and checked with Weck-Cels to be watertight. 0.37mL of Moxfloxacin was injected into the anterior chamber. The lid-speculum was removed.  The drape was removed.  The patient's face was cleaned with a wet and dry 4x4. A clear shield was taped over the eye. The patient was taken to the post-operative care unit in good condition, having tolerated the procedure well.  Post-Op Instructions: The patient will follow up at [facility] for a same day post-operative evaluation and will receive all other orders and instructions.

## 2023-05-27 ENCOUNTER — Encounter (HOSPITAL_COMMUNITY): Payer: Self-pay | Admitting: Ophthalmology

## 2023-05-28 NOTE — H&P (Signed)
Surgical History & Physical  Patient Name: [name] [name]  DOB: [birth year]  Surgery: Cataract extraction with intraocular lens implant phacoemulsification; Left Eye Surgeon: [name] MD Surgery Date: 06/02/2023 Pre-Op Date: 05/26/2023  HPI: A 62 Yr. old female patient 1. The patient is returning after cataract surgery. The right eye is affected. Since the last visit, the affected area is doing well. The patient's vision is improved. The condition's severity is constant. Patient is following medication instructions. Patient also returns for persistent blurry vision in the left eye. The blurry vision has been present for several months. It is worsening. She has trouble with glare with driving as well as reading in lower light. The vision between the two eyes is very different. This is negatively affecting the patient's quality of life and the patient is unable to function adequately in life with the current level of vision. Pt. is ready to proceed with surgery on OS due to blurred vision. HPI was performed by [name] .  Medical History: Cataracts  High Blood Pressure  Review of Systems Cardiovascular High Blood Pressure All recorded systems are negative except as noted above.  Social Never smoked   Medication Prednisolone-moxiflox-bromfen,  lisinopril ,  prednisolone acetate ,  Ilevro ,  ofloxacin   Sx/Procedures Phaco c IOL OD,  Knee Replacement  Drug Allergies  Toradol   History & Physical: Heent: cataract NECK: supple without bruits LUNGS: lungs clear to auscultation CV: regular rate and rhythm Abdomen: soft and non-tender  Impression & Plan: Assessment: 1.  CATARACT EXTRACTION STATUS; Right Eye (Z98.41) 2.  COMBINED FORMS AGE RELATED CATARACT; Left Eye (H25.812)  Plan: 1.  3 days after cataract surgery. Doing well with improved vision and normal eye pressure. Call with any problems or concerns. Continue Pred-Moxi-Brom 3x/day for 4 more days and then 2x/day for  3 more weeks.  2.  Cataract accounts for the patient's decreased vision. This visual impairment is not correctable with a tolerable change in glasses or contact lenses. Cataract surgery with an implantation of a new lens should significantly improve the visual and functional status of the patient. Discussed all risks, benefits, alternatives, and potential complications. Discussed the procedures and recovery. Patient desires to have surgery. A-scan ordered and performed today for intra-ocular lens calculations. The surgery will be performed in order to improve vision for driving, reading, and for eye examinations. Recommend phacoemulsification with intra-ocular lens. Recommend Dextenza for post-operative pain and inflammation. Left Eye. Surgery required to correct imbalance of vision. Dilates well - shugarcaine by protocol.

## 2023-05-29 ENCOUNTER — Encounter (HOSPITAL_COMMUNITY)
Admission: RE | Admit: 2023-05-29 | Discharge: 2023-05-29 | Disposition: A | Discharge status: Home / Self Care | Payer: 59 | Source: Ambulatory Visit | Attending: Ophthalmology | Admitting: Ophthalmology

## 2023-05-29 ENCOUNTER — Encounter (HOSPITAL_COMMUNITY): Payer: Self-pay

## 2023-06-02 ENCOUNTER — Ambulatory Visit (HOSPITAL_COMMUNITY)
Admission: RE | Admit: 2023-06-02 | Discharge: 2023-06-02 | Disposition: A | Discharge status: Home / Self Care | Payer: 59 | Attending: Ophthalmology | Admitting: Ophthalmology

## 2023-06-02 ENCOUNTER — Encounter (HOSPITAL_COMMUNITY)
Admission: RE | Disposition: A | Discharge status: Home / Self Care | Payer: Self-pay | Source: Home / Self Care | Attending: Ophthalmology

## 2023-06-02 ENCOUNTER — Ambulatory Visit (HOSPITAL_BASED_OUTPATIENT_CLINIC_OR_DEPARTMENT_OTHER): Payer: 59 | Admitting: Certified Registered"

## 2023-06-02 ENCOUNTER — Encounter (HOSPITAL_COMMUNITY): Payer: Self-pay | Admitting: Ophthalmology

## 2023-06-02 ENCOUNTER — Ambulatory Visit (HOSPITAL_COMMUNITY): Payer: 59 | Admitting: Certified Registered"

## 2023-06-02 DIAGNOSIS — R7303 Prediabetes: Secondary | ICD-10-CM | POA: Insufficient documentation

## 2023-06-02 DIAGNOSIS — Z9841 Cataract extraction status, right eye: Secondary | ICD-10-CM | POA: Diagnosis not present

## 2023-06-02 DIAGNOSIS — H25812 Combined forms of age-related cataract, left eye: Secondary | ICD-10-CM | POA: Insufficient documentation

## 2023-06-02 DIAGNOSIS — F32A Depression, unspecified: Secondary | ICD-10-CM | POA: Diagnosis not present

## 2023-06-02 DIAGNOSIS — N289 Disorder of kidney and ureter, unspecified: Secondary | ICD-10-CM | POA: Insufficient documentation

## 2023-06-02 DIAGNOSIS — M1990 Unspecified osteoarthritis, unspecified site: Secondary | ICD-10-CM | POA: Insufficient documentation

## 2023-06-02 DIAGNOSIS — I10 Essential (primary) hypertension: Secondary | ICD-10-CM | POA: Diagnosis not present

## 2023-06-02 HISTORY — PX: CATARACT EXTRACTION W/PHACO: SHX586

## 2023-06-02 SURGERY — PHACOEMULSIFICATION, CATARACT, WITH IOL INSERTION
Anesthesia: Monitor Anesthesia Care | Site: Eye | Laterality: Left

## 2023-06-02 MED ORDER — STERILE WATER FOR IRRIGATION IR SOLN
Status: DC | PRN
  Administered 2023-06-02: 1

## 2023-06-02 MED ORDER — PHENYLEPHRINE HCL 2.5 % OP SOLN
1.0000 [drp] | OPHTHALMIC | Status: AC | PRN
Start: 2023-06-02 — End: 2023-06-02
  Administered 2023-06-02 (×3): 1 [drp] via OPHTHALMIC

## 2023-06-02 MED ORDER — LIDOCAINE HCL 3.5 % OP GEL
1.0000 | Freq: Once | OPHTHALMIC | Status: AC
  Administered 2023-06-02: 1 via OPHTHALMIC

## 2023-06-02 MED ORDER — SIGHTPATH DOSE#1 BSS IO SOLN
INTRAOCULAR | Status: DC | PRN
  Administered 2023-06-02: 500 mL via OPHTHALMIC

## 2023-06-02 MED ORDER — LIDOCAINE HCL (PF) 1 % IJ SOLN
INTRAOCULAR | Status: DC | PRN
  Administered 2023-06-02: 1 mL via OPHTHALMIC

## 2023-06-02 MED ORDER — SODIUM CHLORIDE 0.9% FLUSH
INTRAVENOUS | Status: DC | PRN
  Administered 2023-06-02: 5 mL via INTRAVENOUS

## 2023-06-02 MED ORDER — TETRACAINE HCL 0.5 % OP SOLN
1.0000 [drp] | OPHTHALMIC | Status: AC | PRN
  Administered 2023-06-02 (×3): 1 [drp] via OPHTHALMIC

## 2023-06-02 MED ORDER — POVIDONE-IODINE 5 % OP SOLN
OPHTHALMIC | Status: DC | PRN
  Administered 2023-06-02: 1 via OPHTHALMIC

## 2023-06-02 MED ORDER — TROPICAMIDE 1 % OP SOLN
1.0000 [drp] | OPHTHALMIC | Status: AC | PRN
Start: 2023-06-02 — End: 2023-06-02
  Administered 2023-06-02 (×3): 1 [drp] via OPHTHALMIC

## 2023-06-02 MED ORDER — BSS IO SOLN
INTRAOCULAR | Status: DC | PRN
  Administered 2023-06-02: 15 mL via INTRAOCULAR

## 2023-06-02 MED ORDER — SODIUM HYALURONATE 10 MG/ML IO SOLUTION
PREFILLED_SYRINGE | INTRAOCULAR | Status: DC | PRN
  Administered 2023-06-02: .85 mL via INTRAOCULAR

## 2023-06-02 MED ORDER — MIDAZOLAM HCL 2 MG/2ML IJ SOLN
INTRAMUSCULAR | Status: DC | PRN
  Administered 2023-06-02: 2 mg via INTRAVENOUS

## 2023-06-02 MED ORDER — SODIUM HYALURONATE 23MG/ML IO SOSY
PREFILLED_SYRINGE | INTRAOCULAR | Status: DC | PRN
  Administered 2023-06-02: .6 mL via INTRAOCULAR

## 2023-06-02 MED ORDER — MIDAZOLAM HCL 2 MG/2ML IJ SOLN
INTRAMUSCULAR | Status: AC
  Filled 2023-06-02: qty 2

## 2023-06-02 MED ORDER — MOXIFLOXACIN HCL 5 MG/ML IO SOLN
INTRAOCULAR | Status: DC | PRN
  Administered 2023-06-02: .3 mL via INTRACAMERAL

## 2023-06-02 SURGICAL SUPPLY — 13 items
CATARACT SUITE SIGHTPATH (MISCELLANEOUS) ×1
CLOTH BEACON ORANGE TIMEOUT ST (SAFETY) ×1 IMPLANT
EYE SHIELD UNIVERSAL CLEAR (GAUZE/BANDAGES/DRESSINGS) IMPLANT
FEE CATARACT SUITE SIGHTPATH (MISCELLANEOUS) ×1 IMPLANT
GLOVE BIOGEL PI IND STRL 7.0 (GLOVE) ×2 IMPLANT
LENS IOL TECNIS EYHANCE 21.5 (Intraocular Lens) IMPLANT
NDL HYPO 18GX1.5 BLUNT FILL (NEEDLE) ×1 IMPLANT
NEEDLE HYPO 18GX1.5 BLUNT FILL (NEEDLE) ×1
PAD ARMBOARD 7.5X6 YLW CONV (MISCELLANEOUS) ×1 IMPLANT
POSITIONER HEAD 8X9X4 ADT (SOFTGOODS) ×1 IMPLANT
SYR TB 1ML LL NO SAFETY (SYRINGE) ×1 IMPLANT
TAPE SURG TRANSPORE 1 IN (GAUZE/BANDAGES/DRESSINGS) IMPLANT
WATER STERILE IRR 250ML POUR (IV SOLUTION) ×1 IMPLANT

## 2023-06-02 NOTE — Transfer of Care (Signed)
Immediate Anesthesia Transfer of Care Note  Patient: [name] [name]  Procedure(s) Performed: CATARACT EXTRACTION PHACO AND INTRAOCULAR LENS PLACEMENT (IOC) (Left: Eye)  Patient Location: Short Stay  Anesthesia Type:MAC  Level of Consciousness: awake, alert , and oriented  Airway & Oxygen Therapy: Patient Spontanous Breathing  Post-op Assessment: Report given to RN and Post -op Vital signs reviewed and stable  Post vital signs: Reviewed and stable  Last Vitals:  Vitals Value Taken Time  BP    Temp    Pulse    Resp    SpO2      Last Pain:  Vitals:   06/02/23 1151  TempSrc: Oral  PainSc: 0-No pain      Patients Stated Pain Goal: 9 (06/02/23 1151)  Complications: No notable events documented.

## 2023-06-02 NOTE — Interval H&P Note (Signed)
History and Physical Interval Note:  06/02/2023 12:51 PM  [name] [name]  has presented today for surgery, with the diagnosis of combined forms age related cataract, left eye.  The various methods of treatment have been discussed with the patient and family. After consideration of risks, benefits and other options for treatment, the patient has consented to  Procedure(s) with comments: CATARACT EXTRACTION PHACO AND INTRAOCULAR LENS PLACEMENT (IOC) (Left) - CDE: as a surgical intervention.  The patient's history has been reviewed, patient examined, no change in status, stable for surgery.  I have reviewed the patient's chart and labs.  Questions were answered to the patient's satisfaction.     [name]

## 2023-06-02 NOTE — Discharge Instructions (Addendum)
Please discharge patient when stable, will follow up today with Dr. Carnella Fryman at the [facility] office immediately following discharge.  Leave shield in place until visit.  All paperwork with discharge instructions will be given at the office.  [facility] Address:  [address]  [place], [place] [address]  

## 2023-06-02 NOTE — Anesthesia Preprocedure Evaluation (Signed)
Anesthesia Evaluation  Patient identified by MRN, date of birth, ID band Patient awake    Reviewed: Allergy & Precautions, H&P , NPO status , Patient's Chart, lab work & pertinent test results, reviewed documented beta blocker date and time   Airway Mallampati: II  TM Distance: >3 FB Neck ROM: full    Dental no notable dental hx. (+) Dental Advisory Given, Teeth Intact   Pulmonary neg pulmonary ROS   Pulmonary exam normal breath sounds clear to auscultation       Cardiovascular Exercise Tolerance: Good hypertension, Normal cardiovascular exam Rhythm:regular Rate:Normal     Neuro/Psych  PSYCHIATRIC DISORDERS  Depression    negative neurological ROS     GI/Hepatic negative GI ROS, Neg liver ROS,,,  Endo/Other  negative endocrine ROS  Pre diabetic  Renal/GU Renal InsufficiencyRenal disease  negative genitourinary   Musculoskeletal  (+) Arthritis , Osteoarthritis,    Abdominal   Peds  Hematology negative hematology ROS (+)   Anesthesia Other Findings   Reproductive/Obstetrics negative OB ROS                             Anesthesia Physical Anesthesia Plan  ASA: 3  Anesthesia Plan: MAC   Post-op Pain Management: Minimal or no pain anticipated   Induction:   PONV Risk Score and Plan:   Airway Management Planned: Nasal Cannula and Natural Airway  Additional Equipment: None  Intra-op Plan:   Post-operative Plan:   Informed Consent: I have reviewed the patients History and Physical, chart, labs and discussed the procedure including the risks, benefits and alternatives for the proposed anesthesia with the patient or authorized representative who has indicated his/her understanding and acceptance.     Dental Advisory Given  Plan Discussed with: CRNA  Anesthesia Plan Comments:        Anesthesia Quick Evaluation

## 2023-06-02 NOTE — Anesthesia Procedure Notes (Signed)
Procedure Name: MAC Date/Time: 06/02/2023 1:01 PM  Performed by: Julian Reil, CRNAPre-anesthesia Checklist: Patient identified, Emergency Drugs available, Suction available and Patient being monitored Patient Re-evaluated:Patient Re-evaluated prior to induction Oxygen Delivery Method: Nasal cannula Placement Confirmation: positive ETCO2

## 2023-06-02 NOTE — Op Note (Signed)
Date of procedure: 06/02/23  Pre-operative diagnosis: Visually significant age-related combined cataract, Left Eye (H25.812)  Post-operative diagnosis: Visually significant age-related combined cataract, Left Eye (H25.812)  Procedure: Removal of cataract via phacoemulsification and insertion of intra-ocular lens Laural Benes and Johnson DIB00 +17.5D into the capsular bag of the Left Eye  Attending surgeon: Rudy Jew. Onnika Siebel, MD, MA  Anesthesia: MAC, Topical Akten  Complications: None  Estimated Blood Loss: <72mL (minimal)  Specimens: None  Implants: As above  Indications:  Visually significant age-related cataract, Left Eye  Procedure:  The patient was seen and identified in the pre-operative area. The operative eye was identified and dilated.  The operative eye was marked.  Topical anesthesia was administered to the operative eye.     The patient was then to the operative suite and placed in the supine position.  A timeout was performed confirming the patient, procedure to be performed, and all other relevant information.   The patient's face was prepped and draped in the usual fashion for intra-ocular surgery.  A lid speculum was placed into the operative eye and the surgical microscope moved into place and focused.  An inferotemporal paracentesis was created using a 20 gauge paracentesis blade.  Shugarcaine was injected into the anterior chamber.  Viscoelastic was injected into the anterior chamber.  A temporal clear-corneal main wound incision was created using a 2.29mm microkeratome.  A continuous curvilinear capsulorrhexis was initiated using an irrigating cystitome and completed using capsulorrhexis forceps.  Hydrodissection and hydrodeliniation were performed.  Viscoelastic was injected into the anterior chamber.  A phacoemulsification handpiece and a chopper as a second instrument were used to remove the nucleus and epinucleus. The irrigation/aspiration handpiece was used to remove any  remaining cortical material.   The capsular bag was reinflated with viscoelastic, checked, and found to be intact.  The intraocular lens was inserted into the capsular bag.  The irrigation/aspiration handpiece was used to remove any remaining viscoelastic.  The clear corneal wound and paracentesis wounds were then hydrated and checked with Weck-Cels to be watertight. 0.74mL of Moxfloxacin was injected into the anterior chamber. The lid-speculum was removed.  The drape was removed.  The patient's face was cleaned with a wet and dry 4x4.    A clear shield was taped over the eye. The patient was taken to the post-operative care unit in good condition, having tolerated the procedure well.  Post-Op Instructions: The patient will follow up at [facility] for a same day post-operative evaluation and will receive all other orders and instructions.

## 2023-06-02 NOTE — Anesthesia Postprocedure Evaluation (Signed)
Anesthesia Post Note  Patient: [name] [name]  Procedure(s) Performed: CATARACT EXTRACTION PHACO AND INTRAOCULAR LENS PLACEMENT (IOC) (Left: Eye)  Patient location during evaluation: PACU Anesthesia Type: MAC Level of consciousness: awake and alert Pain management: pain level controlled Vital Signs Assessment: post-procedure vital signs reviewed and stable Respiratory status: spontaneous breathing, nonlabored ventilation, respiratory function stable and patient connected to nasal cannula oxygen Cardiovascular status: stable and blood pressure returned to baseline Postop Assessment: no apparent nausea or vomiting Anesthetic complications: no   There were no known notable events for this encounter.   Last Vitals:  Vitals:   06/02/23 1151 06/02/23 1323  BP: (!) 153/84 133/78  Pulse:  60  Resp: 18 18  Temp: (!) 36.3 C 36.7 C  SpO2: 99% 100%    Last Pain:  Vitals:   06/02/23 1323  TempSrc:   PainSc: 0-No pain                 Arleen Bar L Mckenzie Toruno

## 2023-06-04 ENCOUNTER — Encounter (HOSPITAL_COMMUNITY): Payer: Self-pay | Admitting: Ophthalmology

## 2023-11-10 ENCOUNTER — Other Ambulatory Visit (HOSPITAL_COMMUNITY): Payer: Self-pay | Admitting: Family Medicine

## 2023-11-10 ENCOUNTER — Encounter (HOSPITAL_COMMUNITY): Payer: Self-pay | Admitting: Family Medicine

## 2023-11-10 DIAGNOSIS — Z7689 Persons encountering health services in other specified circumstances: Secondary | ICD-10-CM

## 2023-11-20 ENCOUNTER — Inpatient Hospital Stay (HOSPITAL_COMMUNITY): Admission: RE | Admit: 2023-11-20 | Source: Ambulatory Visit

## 2024-06-23 ENCOUNTER — Other Ambulatory Visit (HOSPITAL_COMMUNITY): Payer: Self-pay | Admitting: Nurse Practitioner

## 2024-06-23 DIAGNOSIS — N6452 Nipple discharge: Secondary | ICD-10-CM

## 2024-07-26 ENCOUNTER — Other Ambulatory Visit (HOSPITAL_COMMUNITY): Payer: Self-pay | Admitting: Nurse Practitioner

## 2024-07-26 ENCOUNTER — Inpatient Hospital Stay
Admission: RE | Admit: 2024-07-26 | Discharge: 2024-07-26 | Disposition: A | Payer: Self-pay | Source: Ambulatory Visit | Attending: Nurse Practitioner | Admitting: Nurse Practitioner

## 2024-07-26 DIAGNOSIS — N6452 Nipple discharge: Secondary | ICD-10-CM

## 2024-07-27 ENCOUNTER — Ambulatory Visit (HOSPITAL_COMMUNITY)
Admission: RE | Admit: 2024-07-27 | Discharge: 2024-07-27 | Disposition: A | Discharge status: Home / Self Care | Source: Ambulatory Visit | Attending: Nurse Practitioner | Admitting: Nurse Practitioner

## 2024-07-27 ENCOUNTER — Other Ambulatory Visit (HOSPITAL_COMMUNITY): Payer: Self-pay | Admitting: Nurse Practitioner

## 2024-07-27 ENCOUNTER — Ambulatory Visit (HOSPITAL_COMMUNITY)
Admission: RE | Admit: 2024-07-27 | Discharge: 2024-07-27 | Disposition: A | Source: Ambulatory Visit | Attending: Nurse Practitioner | Admitting: Nurse Practitioner

## 2024-07-27 ENCOUNTER — Encounter (HOSPITAL_COMMUNITY): Payer: Self-pay

## 2024-07-27 DIAGNOSIS — R928 Other abnormal and inconclusive findings on diagnostic imaging of breast: Secondary | ICD-10-CM

## 2024-07-27 DIAGNOSIS — N6452 Nipple discharge: Secondary | ICD-10-CM | POA: Insufficient documentation

## 2024-07-28 ENCOUNTER — Encounter (HOSPITAL_COMMUNITY): Payer: Self-pay | Admitting: Nurse Practitioner

## 2024-08-10 ENCOUNTER — Other Ambulatory Visit (HOSPITAL_COMMUNITY): Payer: Self-pay | Admitting: Nurse Practitioner

## 2024-08-10 ENCOUNTER — Ambulatory Visit (HOSPITAL_COMMUNITY)
Admission: RE | Admit: 2024-08-10 | Discharge: 2024-08-10 | Disposition: A | Source: Ambulatory Visit | Attending: Nurse Practitioner | Admitting: Nurse Practitioner

## 2024-08-10 ENCOUNTER — Encounter (HOSPITAL_COMMUNITY): Payer: Self-pay

## 2024-08-10 DIAGNOSIS — R928 Other abnormal and inconclusive findings on diagnostic imaging of breast: Secondary | ICD-10-CM

## 2024-08-10 MED ORDER — LIDOCAINE-EPINEPHRINE (PF) 1 %-1:200000 IJ SOLN
30.0000 mL | Freq: Once | INTRAMUSCULAR | Status: AC
  Administered 2024-08-10: 20 mL via INTRADERMAL

## 2024-08-10 MED ORDER — LIDOCAINE HCL (PF) 2 % IJ SOLN
INTRAMUSCULAR | Status: AC
  Filled 2024-08-10: qty 30

## 2024-08-10 MED ORDER — LIDOCAINE-EPINEPHRINE (PF) 1 %-1:200000 IJ SOLN
INTRAMUSCULAR | Status: AC
  Filled 2024-08-10: qty 30

## 2024-08-10 MED ORDER — LIDOCAINE HCL (PF) 2 % IJ SOLN
10.0000 mL | Freq: Once | INTRAMUSCULAR | Status: AC
  Administered 2024-08-10: 10 mL via INTRADERMAL

## 2024-08-10 NOTE — Progress Notes (Signed)
 Patient  brought to US  Rm1 in no acute dsitress. Breast biopsy explained. Consent obtained. Prepped and draped in sterile manner. US  imaging obtained. Local anesthetic admin without adverse effect. Access obtained under US  guidance. Samples obtained bilaterally, preserved. ACcess removed, steri strips placed upon access sites bilaterally. Bandages placed. Escorted to mammogram for imaging. DC instructions esxplained and provided.

## 2024-08-11 LAB — SURGICAL PATHOLOGY

## 2024-08-31 ENCOUNTER — Ambulatory Visit: Admitting: General Surgery
